# Patient Record
Sex: Female | Born: 1990 | Race: Black or African American | Hispanic: No | Marital: Single | Smoking: Never smoker
Health system: Southern US, Community
[De-identification: ages and names within clinical notes are randomized; demographics above are authoritative.]

## PROBLEM LIST (undated history)

## (undated) DIAGNOSIS — I10 Essential (primary) hypertension: Secondary | ICD-10-CM

---
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
## 2008-03-24 ENCOUNTER — Inpatient Hospital Stay (HOSPITAL_COMMUNITY): Admission: AD | Admit: 2008-03-24 | Discharge: 2008-03-24 | Payer: Self-pay | Admitting: Obstetrics & Gynecology

## 2010-05-29 LAB — URINALYSIS, ROUTINE W REFLEX MICROSCOPIC
Bilirubin Urine: NEGATIVE
Protein, ur: NEGATIVE mg/dL
Urobilinogen, UA: 0.2 mg/dL (ref 0.0–1.0)

## 2010-05-29 LAB — WET PREP, GENITAL
Trich, Wet Prep: NONE SEEN
Yeast Wet Prep HPF POC: NONE SEEN

## 2010-05-29 LAB — GC/CHLAMYDIA PROBE AMP, GENITAL: Chlamydia, DNA Probe: NEGATIVE

## 2020-09-14 ENCOUNTER — Encounter (HOSPITAL_BASED_OUTPATIENT_CLINIC_OR_DEPARTMENT_OTHER): Payer: Self-pay | Admitting: Emergency Medicine

## 2020-09-14 ENCOUNTER — Emergency Department (HOSPITAL_BASED_OUTPATIENT_CLINIC_OR_DEPARTMENT_OTHER)
Admission: EM | Admit: 2020-09-14 | Discharge: 2020-09-14 | Disposition: A | Discharge status: Home / Self Care | Payer: Medicaid - Out of State | Attending: Emergency Medicine | Admitting: Emergency Medicine

## 2020-09-14 ENCOUNTER — Other Ambulatory Visit: Payer: Self-pay

## 2020-09-14 ENCOUNTER — Emergency Department (HOSPITAL_BASED_OUTPATIENT_CLINIC_OR_DEPARTMENT_OTHER): Payer: Medicaid - Out of State

## 2020-09-14 DIAGNOSIS — I10 Essential (primary) hypertension: Secondary | ICD-10-CM | POA: Diagnosis not present

## 2020-09-14 DIAGNOSIS — D72829 Elevated white blood cell count, unspecified: Secondary | ICD-10-CM | POA: Insufficient documentation

## 2020-09-14 DIAGNOSIS — Z28310 Unvaccinated for covid-19: Secondary | ICD-10-CM | POA: Diagnosis not present

## 2020-09-14 DIAGNOSIS — R1013 Epigastric pain: Secondary | ICD-10-CM | POA: Insufficient documentation

## 2020-09-14 DIAGNOSIS — R112 Nausea with vomiting, unspecified: Secondary | ICD-10-CM | POA: Diagnosis not present

## 2020-09-14 HISTORY — DX: Essential (primary) hypertension: I10

## 2020-09-14 LAB — COMPREHENSIVE METABOLIC PANEL
ALT: 12 U/L (ref 0–44)
AST: 18 U/L (ref 15–41)
Albumin: 4.3 g/dL (ref 3.5–5.0)
Alkaline Phosphatase: 71 U/L (ref 38–126)
Anion gap: 15 (ref 5–15)
BUN: 6 mg/dL (ref 6–20)
CO2: 23 mmol/L (ref 22–32)
Calcium: 9.7 mg/dL (ref 8.9–10.3)
Chloride: 99 mmol/L (ref 98–111)
Creatinine, Ser: 0.61 mg/dL (ref 0.44–1.00)
GFR, Estimated: >60 mL/min (ref >60)
Glucose, Bld: 125 mg/dL — ABNORMAL HIGH (ref 70–99)
Potassium: 2.9 mmol/L — ABNORMAL LOW (ref 3.5–5.1)
Sodium: 137 mmol/L (ref 135–145)
Total Bilirubin: 1.1 mg/dL (ref 0.3–1.2)
Total Protein: 7.6 g/dL (ref 6.5–8.1)

## 2020-09-14 LAB — CBC
HCT: 43.3 % (ref 36.0–46.0)
Hemoglobin: 15.5 g/dL — ABNORMAL HIGH (ref 12.0–15.0)
MCH: 29.6 pg (ref 26.0–34.0)
MCHC: 35.8 g/dL (ref 30.0–36.0)
MCV: 82.8 fL (ref 80.0–100.0)
Platelets: 349 10*3/uL (ref 150–400)
RBC: 5.23 MIL/uL — ABNORMAL HIGH (ref 3.87–5.11)
RDW: 12.7 % (ref 11.5–15.5)
WBC: 14.9 10*3/uL — ABNORMAL HIGH (ref 4.0–10.5)
nRBC: 0 % (ref 0.0–0.2)

## 2020-09-14 LAB — LIPASE, BLOOD: Lipase: 25 U/L (ref 11–51)

## 2020-09-14 LAB — URINALYSIS, ROUTINE W REFLEX MICROSCOPIC
Bilirubin Urine: NEGATIVE
Glucose, UA: NEGATIVE mg/dL
Ketones, ur: 40 mg/dL — AB
Nitrite: NEGATIVE
Protein, ur: >300 mg/dL — AB
RBC / HPF: >50 RBC/hpf — ABNORMAL HIGH (ref 0–5)
Specific Gravity, Urine: 1.034 — ABNORMAL HIGH (ref 1.005–1.030)
pH: 8 (ref 5.0–8.0)

## 2020-09-14 LAB — PREGNANCY, URINE: Preg Test, Ur: NEGATIVE

## 2020-09-14 MED ORDER — CEPHALEXIN 500 MG PO CAPS: 500.0000 mg | ORAL_CAPSULE | Freq: Four times a day (QID) | ORAL | 0 refills | Status: DC

## 2020-09-14 MED ORDER — SODIUM CHLORIDE 0.9 % IV BOLUS
1000.0000 mL | Freq: Once | INTRAVENOUS | Status: AC
  Administered 2020-09-14: 1000 mL via INTRAVENOUS

## 2020-09-14 MED ORDER — LIDOCAINE VISCOUS HCL 2 % MT SOLN
15.0000 mL | Freq: Once | OROMUCOSAL | Status: AC
  Administered 2020-09-14: 15 mL via ORAL
  Filled 2020-09-14: qty 15

## 2020-09-14 MED ORDER — MORPHINE SULFATE (PF) 2 MG/ML IV SOLN
2.0000 mg | Freq: Once | INTRAVENOUS | Status: AC
  Administered 2020-09-14: 2 mg via INTRAVENOUS
  Filled 2020-09-14: qty 1

## 2020-09-14 MED ORDER — ONDANSETRON HCL 4 MG/2ML IJ SOLN
4.0000 mg | Freq: Once | INTRAMUSCULAR | Status: AC
Start: 2020-09-14 — End: 2020-09-14
  Administered 2020-09-14: 4 mg via INTRAVENOUS
  Filled 2020-09-14: qty 2

## 2020-09-14 MED ORDER — SODIUM CHLORIDE 0.9 % IV SOLN
1.0000 g | Freq: Once | INTRAVENOUS | Status: AC
  Administered 2020-09-14: 1 g via INTRAVENOUS
  Filled 2020-09-14: qty 10

## 2020-09-14 MED ORDER — SODIUM CHLORIDE 0.9 % IV SOLN
25.0000 mg | Freq: Once | INTRAVENOUS | Status: AC
  Administered 2020-09-14: 25 mg via INTRAVENOUS
  Filled 2020-09-14: qty 1

## 2020-09-14 MED ORDER — PROMETHAZINE HCL 25 MG PO TABS: 25.0000 mg | ORAL_TABLET | Freq: Four times a day (QID) | ORAL | 0 refills | Status: AC | PRN

## 2020-09-14 MED ORDER — PROMETHAZINE HCL 25 MG/ML IJ SOLN
INTRAMUSCULAR | Status: AC
  Filled 2020-09-14: qty 1

## 2020-09-14 MED ORDER — ONDANSETRON 8 MG PO TBDP: 8.0000 mg | ORAL_TABLET | Freq: Three times a day (TID) | ORAL | 0 refills | Status: AC | PRN

## 2020-09-14 MED ORDER — IOHEXOL 300 MG/ML  SOLN
80.0000 mL | Freq: Once | INTRAMUSCULAR | Status: AC | PRN
  Administered 2020-09-14: 80 mL via INTRAVENOUS

## 2020-09-14 MED ORDER — ALUM & MAG HYDROXIDE-SIMETH 200-200-20 MG/5ML PO SUSP
30.0000 mL | Freq: Once | ORAL | Status: AC
  Administered 2020-09-14: 30 mL via ORAL
  Filled 2020-09-14: qty 30

## 2020-09-14 NOTE — ED Triage Notes (Signed)
Pt arrives to ED with c/o of epigastric abdominal pain that started last night after drinking tequila. The pain is constant and described as throbbing. Pt reports multiple episodes of emesis. Pt denies SOB, CP, and diarrhea.

## 2020-09-14 NOTE — ED Provider Notes (Signed)
[facility] EMERGENCY DEPT Provider Note   CSN: [id number] Arrival date & time: 09/14/20  1454     History Chief Complaint  Patient presents with   Abdominal Pain    Lena Dewing is a 30 y.o. female.  HPI 30 year old female presents today complaining of epigastric pain, nausea, vomiting.  She states this began last night about midnight after consuming a large amount of tequila.  She states this is happened to her before after alcohol intake.  She has had ongoing nausea and vomiting vomiting up to 20 times.  She states it began as bilious but she has seen some blood in it.  She had a similar episode after alcohol intake in the past, but is not a regular drinker.  She denies headache, head injury, chest pain, dyspnea, abnormal bowel movements or diarrhea.  She has not noted any blood in her stool.  She has not had COVID has not received COVID-vaccine.  She reports normal menstrual cycles with her last period 2 weeks ago and a normal period and normal time.     Past Medical History:  Diagnosis Date   Hypertension     There are no problems to display for this patient.   History reviewed. No pertinent surgical history.   OB History   No obstetric history on file.     History reviewed. No pertinent family history.  Social History   Tobacco Use   Smoking status: Never   Smokeless tobacco: Never  Vaping Use   Vaping Use: Every day    Home Medications Prior to Admission medications   Not on File    Allergies    Patient has no known allergies.  Review of Systems   Review of Systems  All other systems reviewed and are negative.  Physical Exam Updated Vital Signs BP (!) 162/102 (BP Location: Right Arm)   Pulse 61   Temp 98.5 F (36.9 C) (Oral)   Resp (!) 26   Ht 1.676 m (5\' 6" )   Wt 90.7 kg   LMP 09/07/2020 (Approximate)   SpO2 100%   BMI 32.28 kg/m   Physical Exam Vitals and nursing note reviewed.  Constitutional:      General: She is in  acute distress.     Appearance: She is well-developed and normal weight. She is not ill-appearing.  HENT:     Head: Normocephalic.     Mouth/Throat:     Mouth: Mucous membranes are moist.  Cardiovascular:     Rate and Rhythm: Normal rate.  Pulmonary:     Effort: Pulmonary effort is normal.     Breath sounds: Normal breath sounds.  Abdominal:     General: Abdomen is flat. Bowel sounds are normal.     Palpations: Abdomen is soft.     Tenderness: There is abdominal tenderness in the epigastric area.  Skin:    General: Skin is warm and dry.     Capillary Refill: Capillary refill takes less than 2 seconds.  Neurological:     General: No focal deficit present.     Mental Status: She is alert.  Psychiatric:        Mood and Affect: Mood normal.    ED Results / Procedures / Treatments   Labs (all labs ordered are listed, but only abnormal results are displayed) Labs Reviewed  CBC - Abnormal; Notable for the following components:      Result Value   RBC 5.23 (*)    Hemoglobin 15.5 (*)  All other components within normal limits  LIPASE, BLOOD  COMPREHENSIVE METABOLIC PANEL  URINALYSIS, ROUTINE W REFLEX MICROSCOPIC  PREGNANCY, URINE    EKG None  Radiology No results found.  Procedures Procedures   Medications Ordered in ED Medications  sodium chloride 0.9 % bolus 1,000 mL (has no administration in time range)  ondansetron (ZOFRAN) injection 4 mg (has no administration in time range)  morphine 2 MG/ML injection 2 mg (has no administration in time range)    ED Course  I have reviewed the triage vital signs and the nursing notes.  Pertinent labs & imaging results that were available during my care of the patient were reviewed by me and considered in my medical decision making (see chart for details).  Clinical Course as of 09/14/20 1829  Thu Sep 14, 2020  1826 Patient feels improved and has tolerated a small amount of water.  No vomiting since last assessment. No  UTI sxs and not currently menstruating.  Urine with some ketones, rbc, epithelial cells-  [DR]    Clinical Course User Index [DR] Margarita Grizzle, MD   MDM Rules/Calculators/A&P                           30 year old female with epigastric pain after alcohol intake, nausea, and vomiting.  Labs obtained did not show any evidence of acute abnormality with the exception of some mild leukocytosis and urinalysis that shows ketonuria, glucose, proteinuria, with greater than 50 red blood cells, 11-20 squamous epithelial cells, and 21-50 white blood cells.  She is not having symptoms consistent with urinary tract infection such as frequency or pain with urination.  However, given urinalysis I am treating here with a gram of Rocephin. Final Clinical Impression(s) / ED Diagnoses Final diagnoses:  None    Rx / DC Orders ED Discharge Orders     None        Margarita Grizzle, MD 09/15/20 1504

## 2020-09-14 NOTE — Discharge Instructions (Addendum)
Avoid alcohol, nonsteroidal anti-inflammatory medications.  Take nausea medicines as needed and drink only clear liquids for the next 24 hours.  Gently advance your diet with soft foods that are on spicy and low-fat.  Take antibiotics for urinary tract infection. Follow-up with your primary care doctor in the next week. Return to the emergency department if you are worse anytime.

## 2020-09-15 ENCOUNTER — Emergency Department (HOSPITAL_BASED_OUTPATIENT_CLINIC_OR_DEPARTMENT_OTHER)
Admission: EM | Admit: 2020-09-15 | Discharge: 2020-09-15 | Disposition: A | Discharge status: Home / Self Care | Payer: Medicaid - Out of State | Attending: Emergency Medicine | Admitting: Emergency Medicine

## 2020-09-15 ENCOUNTER — Encounter (HOSPITAL_BASED_OUTPATIENT_CLINIC_OR_DEPARTMENT_OTHER): Payer: Self-pay | Admitting: Emergency Medicine

## 2020-09-15 ENCOUNTER — Emergency Department (HOSPITAL_BASED_OUTPATIENT_CLINIC_OR_DEPARTMENT_OTHER): Payer: Medicaid - Out of State

## 2020-09-15 DIAGNOSIS — K920 Hematemesis: Secondary | ICD-10-CM | POA: Diagnosis not present

## 2020-09-15 DIAGNOSIS — I10 Essential (primary) hypertension: Secondary | ICD-10-CM | POA: Insufficient documentation

## 2020-09-15 DIAGNOSIS — R079 Chest pain, unspecified: Secondary | ICD-10-CM | POA: Diagnosis not present

## 2020-09-15 DIAGNOSIS — R1013 Epigastric pain: Secondary | ICD-10-CM | POA: Diagnosis present

## 2020-09-15 DIAGNOSIS — K2920 Alcoholic gastritis without bleeding: Secondary | ICD-10-CM | POA: Insufficient documentation

## 2020-09-15 LAB — URINALYSIS, ROUTINE W REFLEX MICROSCOPIC
Bilirubin Urine: NEGATIVE
Glucose, UA: NEGATIVE mg/dL
Ketones, ur: 15 mg/dL — AB
Leukocytes,Ua: NEGATIVE
Nitrite: NEGATIVE
Protein, ur: 30 mg/dL — AB
Specific Gravity, Urine: 1.009 (ref 1.005–1.030)
pH: 7 (ref 5.0–8.0)

## 2020-09-15 LAB — COMPREHENSIVE METABOLIC PANEL
ALT: 11 U/L (ref 0–44)
AST: 17 U/L (ref 15–41)
Albumin: 4.5 g/dL (ref 3.5–5.0)
Alkaline Phosphatase: 70 U/L (ref 38–126)
Anion gap: 14 (ref 5–15)
BUN: <5 mg/dL — ABNORMAL LOW (ref 6–20)
CO2: 22 mmol/L (ref 22–32)
Calcium: 9.8 mg/dL (ref 8.9–10.3)
Chloride: 97 mmol/L — ABNORMAL LOW (ref 98–111)
Creatinine, Ser: 0.65 mg/dL (ref 0.44–1.00)
GFR, Estimated: >60 mL/min (ref >60)
Glucose, Bld: 121 mg/dL — ABNORMAL HIGH (ref 70–99)
Potassium: 2.8 mmol/L — ABNORMAL LOW (ref 3.5–5.1)
Sodium: 133 mmol/L — ABNORMAL LOW (ref 135–145)
Total Bilirubin: 1.2 mg/dL (ref 0.3–1.2)
Total Protein: 7.9 g/dL (ref 6.5–8.1)

## 2020-09-15 LAB — CBC
HCT: 44.5 % (ref 36.0–46.0)
Hemoglobin: 16.1 g/dL — ABNORMAL HIGH (ref 12.0–15.0)
MCH: 29.9 pg (ref 26.0–34.0)
MCHC: 36.2 g/dL — ABNORMAL HIGH (ref 30.0–36.0)
MCV: 82.7 fL (ref 80.0–100.0)
Platelets: 350 10*3/uL (ref 150–400)
RBC: 5.38 MIL/uL — ABNORMAL HIGH (ref 3.87–5.11)
RDW: 12.5 % (ref 11.5–15.5)
WBC: 15.9 10*3/uL — ABNORMAL HIGH (ref 4.0–10.5)
nRBC: 0 % (ref 0.0–0.2)

## 2020-09-15 LAB — TROPONIN I (HIGH SENSITIVITY): Troponin I (High Sensitivity): 5 ng/L (ref <18)

## 2020-09-15 LAB — PREGNANCY, URINE: Preg Test, Ur: NEGATIVE

## 2020-09-15 LAB — LIPASE, BLOOD: Lipase: 46 U/L (ref 11–51)

## 2020-09-15 MED ORDER — HYDROCODONE-ACETAMINOPHEN 5-325 MG PO TABS: 1.0000 | ORAL_TABLET | Freq: Four times a day (QID) | ORAL | 0 refills | Status: DC | PRN

## 2020-09-15 MED ORDER — METOCLOPRAMIDE HCL 5 MG/ML IJ SOLN
10.0000 mg | Freq: Once | INTRAMUSCULAR | Status: AC
  Administered 2020-09-15: 10 mg via INTRAVENOUS
  Filled 2020-09-15: qty 2

## 2020-09-15 MED ORDER — PANTOPRAZOLE SODIUM 40 MG IV SOLR
40.0000 mg | Freq: Once | INTRAVENOUS | Status: AC
  Administered 2020-09-15: 40 mg via INTRAVENOUS
  Filled 2020-09-15: qty 40

## 2020-09-15 MED ORDER — HYDROMORPHONE HCL 1 MG/ML IJ SOLN
1.0000 mg | Freq: Once | INTRAMUSCULAR | Status: AC
Start: 2020-09-15 — End: 2020-09-15
  Administered 2020-09-15: 1 mg via INTRAVENOUS
  Filled 2020-09-15: qty 1

## 2020-09-15 MED ORDER — LIDOCAINE VISCOUS HCL 2 % MT SOLN
15.0000 mL | Freq: Once | OROMUCOSAL | Status: AC
  Administered 2020-09-15: 15 mL via ORAL
  Filled 2020-09-15: qty 15

## 2020-09-15 MED ORDER — LACTATED RINGERS IV BOLUS
1000.0000 mL | Freq: Once | INTRAVENOUS | Status: AC
  Administered 2020-09-15: 1000 mL via INTRAVENOUS

## 2020-09-15 MED ORDER — ALUM & MAG HYDROXIDE-SIMETH 200-200-20 MG/5ML PO SUSP
30.0000 mL | Freq: Once | ORAL | Status: AC
  Administered 2020-09-15: 30 mL via ORAL
  Filled 2020-09-15: qty 30

## 2020-09-15 MED ORDER — OMEPRAZOLE 20 MG PO CPDR: 20.0000 mg | DELAYED_RELEASE_CAPSULE | Freq: Every day | ORAL | 0 refills | Status: AC

## 2020-09-15 MED ORDER — POTASSIUM CHLORIDE 10 MEQ/100ML IV SOLN
10.0000 meq | INTRAVENOUS | Status: AC
  Administered 2020-09-15 (×2): 10 meq via INTRAVENOUS
  Filled 2020-09-15 (×2): qty 100

## 2020-09-15 MED ORDER — MORPHINE SULFATE (PF) 4 MG/ML IV SOLN
4.0000 mg | Freq: Once | INTRAVENOUS | Status: AC
Start: 2020-09-15 — End: 2020-09-15
  Administered 2020-09-15: 4 mg via INTRAVENOUS
  Filled 2020-09-15: qty 1

## 2020-09-15 NOTE — ED Notes (Signed)
Patient refused EKG, states "it's too painful.  Advised patient we needed EKG due to chest pain.  Patient states she will have it later.

## 2020-09-15 NOTE — ED Provider Notes (Signed)
[facility] EMERGENCY DEPT Provider Note   CSN: [id number] Arrival date & time: 09/15/20  1129     History Chief Complaint  Patient presents with   Abdominal Pain   Chest Pain    Emily Mullins is a 30 y.o. female.  Patient is a 30 year old female with a history of hypertension but no other significant medical problems who is returning today due to persistent epigastric discomfort, vomiting and now pain that radiates into her chest.  Patient was seen in the emergency room last night after drinking tequila and starting to have profuse vomiting and epigastric pain.  At that time she was found to be hypokalemic but had a negative CT and negative lipase.  Patient was given medications and she reported she was not back to baseline but better when she went home.  Since returning home around 1 AM she started vomiting and reports she has vomited numerous times.  The pain is getting worse and now radiating into her chest and is worse with every episode of emesis.  She has not had any shortness of breath but since vomiting here has noticed some blood-streaked emesis.  She has no lower abdominal pain or urinary complaints.  She was given Keflex for possible UTI.  She has not had fever and reports she does not usually drink alcohol and does not have known history of ulcer disease.  The history is provided by the patient and medical records.  Abdominal Pain Pain location:  Epigastric Pain quality: cramping, gnawing, sharp and shooting   Pain radiates to:  Chest Pain severity:  Severe Onset quality:  Gradual Duration:  36 hours Timing:  Constant Progression:  Worsening Chronicity:  New Context: alcohol use   Relieved by:  Nothing Worsened by:  Vomiting and eating Ineffective treatments: nausea meds. Associated symptoms: anorexia, chest pain, hematemesis, nausea and vomiting   Associated symptoms: no cough, no diarrhea, no dysuria and no shortness of breath   Chest Pain Associated  symptoms: abdominal pain, anorexia, nausea and vomiting   Associated symptoms: no cough and no shortness of breath       Past Medical History:  Diagnosis Date   Hypertension     There are no problems to display for this patient.   History reviewed. No pertinent surgical history.   OB History   No obstetric history on file.     History reviewed. No pertinent family history.  Social History   Tobacco Use   Smoking status: Never   Smokeless tobacco: Never  Vaping Use   Vaping Use: Every day    Home Medications Prior to Admission medications   Medication Sig Start Date End Date Taking? Authorizing Provider  cephALEXin (KEFLEX) 500 MG capsule Take 1 capsule (500 mg total) by mouth 4 (four) times daily. 09/14/20   Margarita Grizzle, MD  ondansetron (ZOFRAN ODT) 8 MG disintegrating tablet Take 1 tablet (8 mg total) by mouth every 8 (eight) hours as needed for nausea or vomiting. 09/14/20   Margarita Grizzle, MD  promethazine (PHENERGAN) 25 MG tablet Take 1 tablet (25 mg total) by mouth every 6 (six) hours as needed for nausea or vomiting. 09/14/20   Margarita Grizzle, MD    Allergies    Patient has no known allergies.  Review of Systems   Review of Systems  Respiratory:  Negative for cough and shortness of breath.   Cardiovascular:  Positive for chest pain.  Gastrointestinal:  Positive for abdominal pain, anorexia, hematemesis, nausea and vomiting. Negative for diarrhea.  Genitourinary:  Negative for dysuria.  All other systems reviewed and are negative.  Physical Exam Updated Vital Signs BP (!) 154/69   Pulse 81   Temp 98.7 F (37.1 C) (Oral)   Resp 18   LMP 09/07/2020 (Approximate)   SpO2 99%   Physical Exam Vitals and nursing note reviewed.  Constitutional:      General: She is not in acute distress.    Appearance: She is well-developed. She is ill-appearing.  HENT:     Head: Normocephalic and atraumatic.     Mouth/Throat:     Mouth: Mucous membranes are dry.  Eyes:      Pupils: Pupils are equal, round, and reactive to light.  Cardiovascular:     Rate and Rhythm: Normal rate and regular rhythm.     Heart sounds: Normal heart sounds. No murmur heard.   No friction rub.  Pulmonary:     Effort: Pulmonary effort is normal.     Breath sounds: Normal breath sounds. No wheezing or rales.  Chest:     Chest wall: Tenderness present.  Abdominal:     General: Bowel sounds are normal. There is no distension.     Palpations: Abdomen is soft.     Tenderness: There is abdominal tenderness in the epigastric area. There is no guarding or rebound.  Musculoskeletal:        General: No tenderness. Normal range of motion.     Cervical back: Normal range of motion and neck supple.     Right lower leg: No edema.     Left lower leg: No edema.     Comments: No edema  Skin:    General: Skin is warm and dry.     Findings: No rash.  Neurological:     Mental Status: She is alert and oriented to person, place, and time. Mental status is at baseline.     Cranial Nerves: No cranial nerve deficit.  Psychiatric:        Behavior: Behavior normal.    ED Results / Procedures / Treatments   Labs (all labs ordered are listed, but only abnormal results are displayed) Labs Reviewed  COMPREHENSIVE METABOLIC PANEL - Abnormal; Notable for the following components:      Result Value   Sodium 133 (*)    Potassium 2.8 (*)    Chloride 97 (*)    Glucose, Bld 121 (*)    BUN <5 (*)    All other components within normal limits  CBC - Abnormal; Notable for the following components:   WBC 15.9 (*)    RBC 5.38 (*)    Hemoglobin 16.1 (*)    MCHC 36.2 (*)    All other components within normal limits  LIPASE, BLOOD  URINALYSIS, ROUTINE W REFLEX MICROSCOPIC  PREGNANCY, URINE  TROPONIN I (HIGH SENSITIVITY)  TROPONIN I (HIGH SENSITIVITY)    EKG EKG Interpretation  Date/Time:  Friday September 15 2020 11:45:27 EDT Ventricular Rate:  57 PR Interval:  154 QRS Duration: 92 QT  Interval:  470 QTC Calculation: 457 R Axis:   68 Text Interpretation: Sinus bradycardia Nonspecific T wave abnormality No previous tracing Confirmed by Gwyneth Sprout ([id number]) on 09/15/2020 3:52:19 PM  Radiology CT ABDOMEN PELVIS W CONTRAST  Result Date: 09/14/2020 CLINICAL DATA:  Epigastric pain EXAM: CT ABDOMEN AND PELVIS WITH CONTRAST TECHNIQUE: Multidetector CT imaging of the abdomen and pelvis was performed using the standard protocol following bolus administration of intravenous contrast. CONTRAST:  74mL OMNIPAQUE IOHEXOL 300 MG/ML  SOLN COMPARISON:  None. FINDINGS: Lower chest: Lung bases are clear. No effusions. Heart is normal size. Hepatobiliary: No focal hepatic abnormality. Gallbladder unremarkable. Pancreas: No focal abnormality or ductal dilatation. Spleen: No focal abnormality.  Normal size. Adrenals/Urinary Tract: No adrenal abnormality. No focal renal abnormality. No stones or hydronephrosis. Urinary bladder is unremarkable. Stomach/Bowel: Normal appendix. Stomach, large and small bowel grossly unremarkable. Vascular/Lymphatic: No evidence of aneurysm or adenopathy. Reproductive: Uterus and adnexa unremarkable.  No mass. Other: No free fluid or free air. Musculoskeletal: No acute bony abnormality. IMPRESSION: No acute findings in the abdomen or pelvis. Electronically Signed   By: Charlett Nose M.D.   On: 09/14/2020 16:51    Procedures Procedures   Medications Ordered in ED Medications  pantoprazole (PROTONIX) injection 40 mg (has no administration in time range)  metoCLOPramide (REGLAN) injection 10 mg (has no administration in time range)  morphine 4 MG/ML injection 4 mg (has no administration in time range)  lactated ringers bolus 1,000 mL (has no administration in time range)  potassium chloride 10 mEq in 100 mL IVPB (has no administration in time range)    ED Course  I have reviewed the triage vital signs and the nursing notes.  Pertinent labs & imaging results that were  available during my care of the patient were reviewed by me and considered in my medical decision making (see chart for details).    MDM Rules/Calculators/A&P                           Patient returning today for persistent vomiting.  This is in the setting of drinking significant mount of tequila last night.  She was initially seen in the emergency room and given supportive care.  She was treated with Phenergan and Keflex to go home with due to concern for UTI.  Since being home patient has had recurrent vomiting.  She is still having significant epigastric pain now radiating into her chest.  At this time low suspicion for an acute cardiac event think this is still related to an alcoholic gastritis.  Lipase is within normal limits.  Patient is hypokalemic at 2.8 and has leukocytosis of 14,000 is hemoconcentrated with a hemoglobin of 16.  Troponin is within normal limits.  Patient sats are 99% on room air.  We will give pain control, fluids and Protonix.  Chest x-ray is pending.  Low suspicion for Boerhaave's tear or esophageal perforation.  9:54 PM After meds patient is feeling better.  She has been able to tolerate p.o.'s.  Suspect an alcoholic gastritis.  Chest x-ray within normal limits.  Patient discharged home with PPI and pain control.  She already has antiemetics.  MDM   Amount and/or Complexity of Data Reviewed Clinical lab tests: ordered and reviewed Tests in the radiology section of CPT: ordered and reviewed Tests in the medicine section of CPT: ordered and reviewed Independent visualization of images, tracings, or specimens: yes    Final Clinical Impression(s) / ED Diagnoses Final diagnoses:  Acute alcoholic gastritis without hemorrhage    Rx / DC Orders ED Discharge Orders          Ordered    HYDROcodone-acetaminophen (NORCO/VICODIN) 5-325 MG tablet  Every 6 hours PRN        09/15/20 2154    omeprazole (PRILOSEC) 20 MG capsule  Daily        09/15/20 2154  Gwyneth SproutPlunkett, Kosha Jaquith, MD 09/15/20 2155

## 2020-09-15 NOTE — Discharge Instructions (Signed)
I am concerned that you have significant irritation of your stomach from the alcohol yesterday and it is very inflamed which is what is causing the burning and the pain.  You can take the nausea medication but also I want you to take an antacid which in the next 3 to 4 days should hopefully stopped the acid production in your stomach.  You can use the pain medication as needed in the meantime.

## 2020-09-15 NOTE — ED Triage Notes (Signed)
Pt arrives to ED with c/o of epigastric pain that started again at 1am last night. Pt was seen here and treated for same yesterday. Pt w/ negative CT abdomen and received fluids, phenergan, and morphine and was dx w/o pain and N/V. Pt reports at 1am last night she started vomiting again and now the pain is a sharp and burning sensation in her mid-chest. Pt reports taking her first dose of Cephalexin last night.

## 2021-09-20 ENCOUNTER — Other Ambulatory Visit: Payer: Self-pay

## 2021-09-20 ENCOUNTER — Encounter (HOSPITAL_BASED_OUTPATIENT_CLINIC_OR_DEPARTMENT_OTHER): Payer: Self-pay | Admitting: Emergency Medicine

## 2021-09-20 ENCOUNTER — Emergency Department (HOSPITAL_BASED_OUTPATIENT_CLINIC_OR_DEPARTMENT_OTHER): Payer: Medicaid - Out of State

## 2021-09-20 ENCOUNTER — Emergency Department (HOSPITAL_BASED_OUTPATIENT_CLINIC_OR_DEPARTMENT_OTHER)
Admission: EM | Admit: 2021-09-20 | Discharge: 2021-09-20 | Disposition: A | Discharge status: Home / Self Care | Payer: Medicaid - Out of State | Attending: Emergency Medicine | Admitting: Emergency Medicine

## 2021-09-20 DIAGNOSIS — A64 Unspecified sexually transmitted disease: Secondary | ICD-10-CM | POA: Diagnosis not present

## 2021-09-20 DIAGNOSIS — Z3A01 Less than 8 weeks gestation of pregnancy: Secondary | ICD-10-CM | POA: Diagnosis not present

## 2021-09-20 DIAGNOSIS — R102 Pelvic and perineal pain: Secondary | ICD-10-CM | POA: Diagnosis not present

## 2021-09-20 DIAGNOSIS — O23591 Infection of other part of genital tract in pregnancy, first trimester: Secondary | ICD-10-CM | POA: Insufficient documentation

## 2021-09-20 DIAGNOSIS — O209 Hemorrhage in early pregnancy, unspecified: Secondary | ICD-10-CM | POA: Diagnosis present

## 2021-09-20 DIAGNOSIS — A5901 Trichomonal vulvovaginitis: Secondary | ICD-10-CM

## 2021-09-20 LAB — CBC WITH DIFFERENTIAL/PLATELET
Abs Immature Granulocytes: 0.02 10*3/uL (ref 0.00–0.07)
Basophils Absolute: 0.1 10*3/uL (ref 0.0–0.1)
Basophils Relative: 1 %
Eosinophils Absolute: 0.1 10*3/uL (ref 0.0–0.5)
Eosinophils Relative: 1 %
HCT: 38.3 % (ref 36.0–46.0)
Hemoglobin: 13.6 g/dL (ref 12.0–15.0)
Immature Granulocytes: 0 %
Lymphocytes Relative: 19 %
Lymphs Abs: 1.8 10*3/uL (ref 0.7–4.0)
MCH: 28.9 pg (ref 26.0–34.0)
MCHC: 35.5 g/dL (ref 30.0–36.0)
MCV: 81.5 fL (ref 80.0–100.0)
Monocytes Absolute: 0.4 10*3/uL (ref 0.1–1.0)
Monocytes Relative: 5 %
Neutro Abs: 7 10*3/uL (ref 1.7–7.7)
Neutrophils Relative %: 74 %
Platelets: 262 10*3/uL (ref 150–400)
RBC: 4.7 MIL/uL (ref 3.87–5.11)
RDW: 12.3 % (ref 11.5–15.5)
WBC: 9.4 10*3/uL (ref 4.0–10.5)
nRBC: 0 % (ref 0.0–0.2)

## 2021-09-20 LAB — URINALYSIS, ROUTINE W REFLEX MICROSCOPIC
Bilirubin Urine: NEGATIVE
Glucose, UA: NEGATIVE mg/dL
Ketones, ur: NEGATIVE mg/dL
Nitrite: NEGATIVE
Specific Gravity, Urine: 1.022 (ref 1.005–1.030)
pH: 7 (ref 5.0–8.0)

## 2021-09-20 LAB — COMPREHENSIVE METABOLIC PANEL
ALT: 9 U/L (ref 0–44)
AST: 13 U/L — ABNORMAL LOW (ref 15–41)
Albumin: 4.3 g/dL (ref 3.5–5.0)
Alkaline Phosphatase: 49 U/L (ref 38–126)
Anion gap: 10 (ref 5–15)
BUN: 5 mg/dL — ABNORMAL LOW (ref 6–20)
CO2: 22 mmol/L (ref 22–32)
Calcium: 9.3 mg/dL (ref 8.9–10.3)
Chloride: 104 mmol/L (ref 98–111)
Creatinine, Ser: 0.52 mg/dL (ref 0.44–1.00)
GFR, Estimated: >60 mL/min (ref >60)
Glucose, Bld: 92 mg/dL (ref 70–99)
Potassium: 3.3 mmol/L — ABNORMAL LOW (ref 3.5–5.1)
Sodium: 136 mmol/L (ref 135–145)
Total Bilirubin: 0.6 mg/dL (ref 0.3–1.2)
Total Protein: 7.5 g/dL (ref 6.5–8.1)

## 2021-09-20 LAB — HCG, QUANTITATIVE, PREGNANCY: hCG, Beta Chain, Quant, S: 112258 m[IU]/mL — ABNORMAL HIGH (ref <5)

## 2021-09-20 LAB — PREGNANCY, URINE: Preg Test, Ur: POSITIVE — AB

## 2021-09-20 MED ORDER — AZITHROMYCIN 250 MG PO TABS
1000.0000 mg | ORAL_TABLET | Freq: Once | ORAL | Status: AC
Start: 2021-09-20 — End: 2021-09-20
  Administered 2021-09-20: 1000 mg via ORAL
  Filled 2021-09-20: qty 4

## 2021-09-20 MED ORDER — LIDOCAINE HCL (PF) 1 % IJ SOLN
1.0000 mL | Freq: Once | INTRAMUSCULAR | Status: AC
  Administered 2021-09-20: 1 mL
  Filled 2021-09-20: qty 5

## 2021-09-20 MED ORDER — METRONIDAZOLE 500 MG PO TABS: 500.0000 mg | ORAL_TABLET | Freq: Two times a day (BID) | ORAL | 0 refills | Status: AC

## 2021-09-20 MED ORDER — CEFTRIAXONE SODIUM 500 MG IJ SOLR
500.0000 mg | Freq: Once | INTRAMUSCULAR | Status: AC
  Administered 2021-09-20: 500 mg via INTRAMUSCULAR
  Filled 2021-09-20: qty 500

## 2021-09-20 NOTE — ED Triage Notes (Signed)
Last menstrual June 18 Last week had a +pregnancy test. Has been having a lot of cramping lower abdominal pain and lower back pains. Bleeding Tuesday "looked like a normal period"  No prenatal visits yet  "Just wants to get checked out"

## 2021-09-20 NOTE — Discharge Instructions (Addendum)
You were treated today for gonorrhea and chlamydia.  Will also be treated for trichomonas which was found in your urine, I have prescribed an antibiotic to help treatment of this.  Please take 1 tablet twice a day for the next 7 days.

## 2021-09-20 NOTE — ED Provider Notes (Signed)
[facility] EMERGENCY DEPT Provider Note   CSN: [id number] Arrival date & time: 09/20/21  1531     History Chief Complaint  Patient presents with   Possible Pregnancy    With abdominal pain    Emily Mullins is a 31 y.o. female.  31 y.o female G2P1 currently < [redacted] weeks pregnant presents to the ED with a chief complaint of lower abdominal cramping.  Patient reports she took a pregnancy test on June 18, this was positive.  Her last menstrual cycle was around that time.  She reports some lower abdominal cramping, exacerbated with movement. Has taken tylenol without any improvement in symptoms.  Also reports some vaginal bleeding such as spotting, "like a normal period ".  She does report feeling somewhat dizziness and weak when she was at her daughter's birthday recently.  He has not had any prenatal care, she reports "I am waiting on my Medicaid ".  She is also endorsing a headache, does have a longstanding history of headaches however she reports they have gotten worse since her pregnancy. No prior prenatal care.   The history is provided by the patient and medical records.  Possible Pregnancy This is a new problem. Associated symptoms include abdominal pain. Pertinent negatives include no chest pain and no shortness of breath.       Home Medications Prior to Admission medications   Medication Sig Start Date End Date Taking? Authorizing Provider  metroNIDAZOLE (FLAGYL) 500 MG tablet Take 1 tablet (500 mg total) by mouth 2 (two) times daily for 7 days. 09/20/21 09/27/21 Yes Catia Todorov, PA-C  cephALEXin (KEFLEX) 500 MG capsule Take 1 capsule (500 mg total) by mouth 4 (four) times daily. 09/14/20   Margarita Grizzle, MD  HYDROcodone-acetaminophen (NORCO/VICODIN) 5-325 MG tablet Take 1 tablet by mouth every 6 (six) hours as needed. 09/15/20   Gwyneth Sprout, MD  omeprazole (PRILOSEC) 20 MG capsule Take 1 capsule (20 mg total) by mouth daily. 09/15/20   Gwyneth Sprout, MD   ondansetron (ZOFRAN ODT) 8 MG disintegrating tablet Take 1 tablet (8 mg total) by mouth every 8 (eight) hours as needed for nausea or vomiting. 09/14/20   Margarita Grizzle, MD  promethazine (PHENERGAN) 25 MG tablet Take 1 tablet (25 mg total) by mouth every 6 (six) hours as needed for nausea or vomiting. 09/14/20   Margarita Grizzle, MD      Allergies    Patient has no known allergies.    Review of Systems   Review of Systems  Constitutional:  Negative for fever.  HENT:  Negative for sore throat.   Respiratory:  Negative for shortness of breath.   Cardiovascular:  Negative for chest pain.  Gastrointestinal:  Positive for abdominal pain. Negative for diarrhea, nausea and vomiting.  Genitourinary:  Positive for pelvic pain.  Musculoskeletal:  Negative for back pain.  Skin:  Negative for pallor and wound.  Neurological:  Negative for light-headedness and numbness.  All other systems reviewed and are negative.   Physical Exam Updated Vital Signs BP (!) 141/93   Pulse 64   Temp (!) 96.9 F (36.1 C) (Axillary)   Resp 16   LMP 08/28/2021   SpO2 100%  Physical Exam Vitals and nursing note reviewed.  Constitutional:      Appearance: Normal appearance.  HENT:     Head: Normocephalic and atraumatic.  Eyes:     Pupils: Pupils are equal, round, and reactive to light.  Cardiovascular:     Rate and Rhythm: Normal rate.  Pulmonary:     Effort: Pulmonary effort is normal.     Breath sounds: No wheezing or rales.  Abdominal:     General: Abdomen is flat.     Tenderness: There is abdominal tenderness.  Musculoskeletal:     Cervical back: Normal range of motion and neck supple.  Skin:    General: Skin is warm and dry.  Neurological:     Mental Status: She is alert and oriented to person, place, and time.     ED Results / Procedures / Treatments   Labs (all labs ordered are listed, but only abnormal results are displayed) Labs Reviewed  COMPREHENSIVE METABOLIC PANEL - Abnormal; Notable  for the following components:      Result Value   Potassium 3.3 (*)    BUN 5 (*)    AST 13 (*)    All other components within normal limits  PREGNANCY, URINE - Abnormal; Notable for the following components:   Preg Test, Ur POSITIVE (*)    All other components within normal limits  URINALYSIS, ROUTINE W REFLEX MICROSCOPIC - Abnormal; Notable for the following components:   APPearance HAZY (*)    Hgb urine dipstick MODERATE (*)    Protein, ur TRACE (*)    Leukocytes,Ua LARGE (*)    Bacteria, UA RARE (*)    Trichomonas, UA PRESENT (*)    All other components within normal limits  HCG, QUANTITATIVE, PREGNANCY - Abnormal; Notable for the following components:   hCG, Beta Chain, Quant, S 112,258 (*)    All other components within normal limits  CBC WITH DIFFERENTIAL/PLATELET    EKG None  Radiology US OB Comp < 14 Wks  Result Date: 09/20/2021 CLINICAL DATA:  Pelvic pain, back pain, vaginal bleeding EXAM: OBSTETRIC <14 WK ULTRASOUND TECHNIQUE: Transabdominal ultrasound was performed for evaluation of the gestation as well as the maternal uterus and adnexal regions. COMPARISON:  None Available. FINDINGS: Intrauterine gestational sac: Single Yolk sac:  Seen Embryo:  Seen Cardiac Activity: Seen Heart Rate: 169 bpm CRL:   17.6 mm   8 w 2 d                  [place] EDC: 04/30/2022 Subchorionic hemorrhage:  None visualized. Maternal uterus/adnexae: Unremarkable. IMPRESSION: Single live intrauterine pregnancy seen. Sonographically estimated gestational age is 8 weeks 2 days. Electronically Signed   By: Ernie Avena M.D.   On: 09/20/2021 18:13    Procedures Procedures    Medications Ordered in ED Medications  cefTRIAXone (ROCEPHIN) injection 500 mg (500 mg Intramuscular Given 09/20/21 1850)  lidocaine (PF) (XYLOCAINE) 1 % injection 1-2.1 mL (1 mL Other Given 09/20/21 1851)  azithromycin (ZITHROMAX) tablet 1,000 mg (1,000 mg Oral Given 09/20/21 1849)    ED Course/ Medical Decision Making/  A&P Clinical Course as of 09/20/21 1905  Thu Sep 20, 2021  1551 hCG, quantitative, pregnancy [JS]  1820 HCG, Beta Chain, Quant, S(!): 638,756 [JS]    Clinical Course User Index [JS] Claude Manges, PA-C                           Medical Decision Making Amount and/or Complexity of Data Reviewed Labs: ordered. Decision-making details documented in ED Course. Radiology: ordered.  Risk Prescription drug management.   This patient presents to the ED for concern of lower abdominal pain and pregnancy, this involves a number of treatment options, and is a complaint that carries with it a high risk of  complications and morbidity.  The differential diagnosis includes ectopic versus IUP.    Co morbidities: Discussed in HPI   Brief History:  Patient here complaining of lower abdominal cramping after a positive pregnancy test in the month of June, no prenatal care.  Here with daily headaches no relief with Tylenol.  EMR reviewed including pt PMHx, past surgical history and past visits to ER.   See HPI for more details   Lab Tests:  I ordered and independently interpreted labs.  The pertinent results include:    I personally reviewed all laboratory work and imaging. Metabolic panel without any acute abnormality specifically kidney function within normal limits and no significant electrolyte abnormalities. CBC without leukocytosis or significant anemia. UA with moderate hemoglobin, Large leukocytes, rare bacteria and trichomonas present.   Imaging Studies:  US OB: Single live intrauterine pregnancy seen. Sonographically estimated  gestational age is 8 weeks 2 days.   Medicines ordered:  I ordered medication including rocephin, azithromycin  for prophylactic treatment of gonorrhea and chlamydia after positive trichomonas in her urine. Reevaluation of the patient after these medicines showed that the patient stayed the same I have reviewed the patients home medicines and have made  adjustments as needed   Reevaluation:  After the interventions noted above I re-evaluated patient and found that they have :stayed the same   Social Determinants of Health:  The patient's social determinants of health were a factor in the care of this patient    Problem List / ED Course:  Patient to the ED with a chief complaint of lower abdominal cramping, had a positive pregnancy test in the month of June.  No prenatal care.  Labs were overall benign.  UA with trichomonas present and large amount of bacteria.  Ultrasound showing IUP, the results were given to patient for confirmation.  She was treated for gonorrhea and chlamydia with Rocephin along with azithromycin.  She will also go home on a short course of Flagyl for treatment of trichomonas.  She is agreeable to schedule appointment with OB/GYN.  Patient is overall in stable condition.   Dispostion:  After consideration of the diagnostic results and the patients response to treatment, I feel that the patent would benefit from treatment of trichomonas with Flagyl.  Will need to establish OB/GYN for her new pregnancy.    Portions of this note were generated with Scientist, clinical (histocompatibility and immunogenetics). Dictation errors may occur despite best attempts at proofreading.   Final Clinical Impression(s) / ED Diagnoses Final diagnoses:  Less than [redacted] weeks gestation of pregnancy  Trichomonas vaginitis  Sexually transmitted infection    Rx / DC Orders ED Discharge Orders          Ordered    metroNIDAZOLE (FLAGYL) 500 MG tablet  2 times daily        09/20/21 1903              Claude Manges, PA-C 09/20/21 1905    Virgina Norfolk, DO 09/24/21 [phone]

## 2021-09-20 NOTE — ED Notes (Signed)
Pt just drank cold water. Pt states that she felt fine

## 2021-09-20 NOTE — ED Notes (Signed)
Patient verbalizes understanding of discharge instructions. Opportunity for questioning and answers were provided. Armband removed by staff, pt discharged from ED. Ambulated out to lobby  

## 2022-04-08 ENCOUNTER — Other Ambulatory Visit: Payer: Self-pay

## 2022-04-08 ENCOUNTER — Encounter (HOSPITAL_BASED_OUTPATIENT_CLINIC_OR_DEPARTMENT_OTHER): Payer: Self-pay

## 2022-04-08 ENCOUNTER — Emergency Department (HOSPITAL_BASED_OUTPATIENT_CLINIC_OR_DEPARTMENT_OTHER)
Admission: EM | Admit: 2022-04-08 | Discharge: 2022-04-08 | Disposition: A | Discharge status: Home / Self Care | Payer: Medicaid - Out of State | Attending: Emergency Medicine | Admitting: Emergency Medicine

## 2022-04-08 ENCOUNTER — Other Ambulatory Visit (HOSPITAL_BASED_OUTPATIENT_CLINIC_OR_DEPARTMENT_OTHER): Payer: Self-pay

## 2022-04-08 DIAGNOSIS — N3000 Acute cystitis without hematuria: Secondary | ICD-10-CM | POA: Insufficient documentation

## 2022-04-08 DIAGNOSIS — R300 Dysuria: Secondary | ICD-10-CM | POA: Diagnosis present

## 2022-04-08 LAB — URINALYSIS, ROUTINE W REFLEX MICROSCOPIC
Bilirubin Urine: NEGATIVE
Glucose, UA: NEGATIVE mg/dL
Ketones, ur: NEGATIVE mg/dL
Nitrite: NEGATIVE
Protein, ur: NEGATIVE mg/dL
Specific Gravity, Urine: 1.013 (ref 1.005–1.030)
WBC, UA: >50 WBC/hpf (ref 0–5)
pH: 6 (ref 5.0–8.0)

## 2022-04-08 LAB — PREGNANCY, URINE: Preg Test, Ur: NEGATIVE

## 2022-04-08 MED ORDER — DICYCLOMINE HCL 20 MG PO TABS
20.0000 mg | ORAL_TABLET | Freq: Two times a day (BID) | ORAL | 0 refills | Status: AC
  Filled 2022-04-08: qty 20, 10d supply, fill #0

## 2022-04-08 MED ORDER — CEPHALEXIN 500 MG PO CAPS
500.0000 mg | ORAL_CAPSULE | Freq: Three times a day (TID) | ORAL | 0 refills | Status: AC
  Filled 2022-04-08: qty 15, 5d supply, fill #0

## 2022-04-08 NOTE — ED Triage Notes (Signed)
Patient here POV from Home.  Endorses Lower Pelvic Pain with Dysuria over 2-3 Days.  No fevers. No N/V/D. No Hematuria.   NAD Noted during Triage. A&Ox4. GCS 15. Ambulatory.

## 2022-04-08 NOTE — ED Provider Notes (Signed)
[place] Provider Note   CSN: [id number] Arrival date & time: 04/08/22  1415     History  Chief Complaint  Patient presents with   Dysuria    Emily Mullins is a 32 y.o. female.  Patient here for painful urination for the last few days.  No flank pain.  No history of kidney stones.  Nothing makes it worse or better.  Has been taking some cranberry.  Denies any concern for being pregnant.  No nausea or vomiting.  No abdominal pain.  Having some spasms in her bladder at times.  The history is provided by the patient.       Home Medications Prior to Admission medications   Medication Sig Start Date End Date Taking? Authorizing Provider  dicyclomine (BENTYL) 20 MG tablet Take 1 tablet (20 mg total) by mouth 2 (two) times daily. 04/08/22  Yes Arnell Mausolf, DO  cephALEXin (KEFLEX) 500 MG capsule Take 1 capsule (500 mg total) by mouth 3 (three) times daily for 5 days. 04/08/22 04/13/22  Lennice Sites, DO  HYDROcodone-acetaminophen (NORCO/VICODIN) 5-325 MG tablet Take 1 tablet by mouth every 6 (six) hours as needed. 09/15/20   Blanchie Dessert, MD  omeprazole (PRILOSEC) 20 MG capsule Take 1 capsule (20 mg total) by mouth daily. 09/15/20   Blanchie Dessert, MD  ondansetron (ZOFRAN ODT) 8 MG disintegrating tablet Take 1 tablet (8 mg total) by mouth every 8 (eight) hours as needed for nausea or vomiting. 09/14/20   Pattricia Boss, MD  promethazine (PHENERGAN) 25 MG tablet Take 1 tablet (25 mg total) by mouth every 6 (six) hours as needed for nausea or vomiting. 09/14/20   Pattricia Boss, MD      Allergies    Patient has no known allergies.    Review of Systems   Review of Systems  Physical Exam Updated Vital Signs BP (!) 140/100   Pulse 73   Temp 98.2 F (36.8 C) (Oral)   Resp 16   Ht '5\' 6"'$  (1.676 m)   Wt 97 kg   LMP 08/28/2021   SpO2 98%   Breastfeeding Unknown   BMI 34.53 kg/m  Physical Exam Vitals and nursing note reviewed.   Constitutional:      General: She is not in acute distress.    Appearance: She is well-developed. She is not ill-appearing.  HENT:     Head: Normocephalic and atraumatic.  Eyes:     Conjunctiva/sclera: Conjunctivae normal.     Pupils: Pupils are equal, round, and reactive to light.  Cardiovascular:     Rate and Rhythm: Normal rate and regular rhythm.     Pulses: Normal pulses.     Heart sounds: Normal heart sounds. No murmur heard. Pulmonary:     Effort: Pulmonary effort is normal. No respiratory distress.     Breath sounds: Normal breath sounds.  Abdominal:     Palpations: Abdomen is soft.     Tenderness: There is no abdominal tenderness.  Musculoskeletal:        General: No swelling.     Cervical back: Normal range of motion and neck supple.  Skin:    General: Skin is warm and dry.     Capillary Refill: Capillary refill takes less than 2 seconds.  Neurological:     Mental Status: She is alert.  Psychiatric:        Mood and Affect: Mood normal.     ED Results / Procedures / Treatments   Labs (all  labs ordered are listed, but only abnormal results are displayed) Labs Reviewed  URINALYSIS, ROUTINE W REFLEX MICROSCOPIC - Abnormal; Notable for the following components:      Result Value   APPearance HAZY (*)    Hgb urine dipstick MODERATE (*)    Leukocytes,Ua LARGE (*)    Bacteria, UA MANY (*)    Non Squamous Epithelial 0-5 (*)    All other components within normal limits  PREGNANCY, URINE    EKG None  Radiology No results found.  Procedures Procedures    Medications Ordered in ED Medications - No data to display  ED Course/ Medical Decision Making/ A&P                             Medical Decision Making Amount and/or Complexity of Data Reviewed Labs: ordered.  Risk Prescription drug management.   [name] is here with painful urination.  Normal vitals.  No fever.  Urinalysis consistent with infection.  Pregnancy test is negative.  She has no  flank pain.  No concern for pyelonephritis or other intra-abdominal process.  Very well-appearing.  Will prescribe Bentyl and Keflex.  Discharged in good condition.  This chart was dictated using voice recognition software.  Despite best efforts to proofread,  errors can occur which can change the documentation meaning.         Final Clinical Impression(s) / ED Diagnoses Final diagnoses:  Acute cystitis without hematuria    Rx / DC Orders ED Discharge Orders          Ordered    cephALEXin (KEFLEX) 500 MG capsule  3 times daily        04/08/22 1439    dicyclomine (BENTYL) 20 MG tablet  2 times daily        04/08/22 1439              Lennice Sites, DO 04/08/22 1441

## 2022-06-14 ENCOUNTER — Telehealth: Payer: Self-pay

## 2022-06-14 NOTE — Telephone Encounter (Signed)
Left message to return call to scheduling appointment with NP to establish care. 

## 2022-10-05 ENCOUNTER — Other Ambulatory Visit: Payer: Self-pay

## 2022-10-05 ENCOUNTER — Emergency Department (HOSPITAL_BASED_OUTPATIENT_CLINIC_OR_DEPARTMENT_OTHER)
Admission: EM | Admit: 2022-10-05 | Discharge: 2022-10-05 | Disposition: A | Discharge status: Home / Self Care | Payer: PRIVATE HEALTH INSURANCE | Attending: Emergency Medicine | Admitting: Emergency Medicine

## 2022-10-05 ENCOUNTER — Encounter (HOSPITAL_BASED_OUTPATIENT_CLINIC_OR_DEPARTMENT_OTHER): Payer: Self-pay | Admitting: Emergency Medicine

## 2022-10-05 ENCOUNTER — Emergency Department (HOSPITAL_BASED_OUTPATIENT_CLINIC_OR_DEPARTMENT_OTHER): Payer: PRIVATE HEALTH INSURANCE | Admitting: Radiology

## 2022-10-05 DIAGNOSIS — M25531 Pain in right wrist: Secondary | ICD-10-CM | POA: Insufficient documentation
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
## 2022-10-05 NOTE — Discharge Instructions (Addendum)
Use ibuprofen as directed.  Follow-up with a hand surgeon as needed.  You may also want to wear a wrist brace which you can purchase at a pharmacy
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
## 2022-12-11 ENCOUNTER — Other Ambulatory Visit (HOSPITAL_BASED_OUTPATIENT_CLINIC_OR_DEPARTMENT_OTHER): Payer: Self-pay

## 2022-12-11 ENCOUNTER — Encounter (HOSPITAL_BASED_OUTPATIENT_CLINIC_OR_DEPARTMENT_OTHER): Payer: Self-pay

## 2022-12-11 ENCOUNTER — Emergency Department (HOSPITAL_BASED_OUTPATIENT_CLINIC_OR_DEPARTMENT_OTHER)
Admission: EM | Admit: 2022-12-11 | Discharge: 2022-12-11 | Disposition: A | Discharge status: Home / Self Care | Payer: Medicaid - Out of State | Attending: Emergency Medicine | Admitting: Emergency Medicine

## 2022-12-11 ENCOUNTER — Other Ambulatory Visit: Payer: Self-pay

## 2022-12-11 DIAGNOSIS — Z1152 Encounter for screening for COVID-19: Secondary | ICD-10-CM | POA: Insufficient documentation

## 2022-12-11 DIAGNOSIS — J069 Acute upper respiratory infection, unspecified: Secondary | ICD-10-CM | POA: Insufficient documentation

## 2022-12-11 LAB — RESP PANEL BY RT-PCR (RSV, FLU A&B, COVID)  RVPGX2
Influenza A by PCR: NEGATIVE
Influenza B by PCR: NEGATIVE
Resp Syncytial Virus by PCR: NEGATIVE
SARS Coronavirus 2 by RT PCR: NEGATIVE

## 2022-12-11 MED ORDER — PROMETHAZINE-DM 6.25-15 MG/5ML PO SYRP
5.0000 mL | ORAL_SOLUTION | Freq: Four times a day (QID) | ORAL | 0 refills | Status: AC | PRN
  Filled 2022-12-11: qty 118, 6d supply, fill #0

## 2022-12-11 NOTE — ED Provider Notes (Signed)
[place] EMERGENCY DEPARTMENT AT [facility] Provider Note   CSN: [id number] Arrival date & time: 12/11/22  1454     History  Chief Complaint  Patient presents with   Cough    Emily Mullins is a 32 y.o. female.  Patient with noncontributory past medical history presents today with complaints of cough.  She states that same began 3 days ago and has been persistent since then.  Denies fevers or chills.  No chest pain or shortness of breath.  Daughter at home sick with similar symptoms.  She has tried TheraFlu with minimal relief.  No headache, neck pain, abdominal pain, nausea, or vomiting.  The history is provided by the patient. No language interpreter was used.  Cough      Home Medications Prior to Admission medications   Medication Sig Start Date End Date Taking? Authorizing Provider  dicyclomine (BENTYL) 20 MG tablet Take 1 tablet (20 mg total) by mouth 2 (two) times daily. 04/08/22   Curatolo, Adam, DO  HYDROcodone-acetaminophen (NORCO/VICODIN) 5-325 MG tablet Take 1 tablet by mouth every 6 (six) hours as needed. 09/15/20   Gwyneth Sprout, MD  omeprazole (PRILOSEC) 20 MG capsule Take 1 capsule (20 mg total) by mouth daily. 09/15/20   Gwyneth Sprout, MD  ondansetron (ZOFRAN ODT) 8 MG disintegrating tablet Take 1 tablet (8 mg total) by mouth every 8 (eight) hours as needed for nausea or vomiting. 09/14/20   Margarita Grizzle, MD  promethazine (PHENERGAN) 25 MG tablet Take 1 tablet (25 mg total) by mouth every 6 (six) hours as needed for nausea or vomiting. 09/14/20   Margarita Grizzle, MD      Allergies    Strawberry flavor    Review of Systems   Review of Systems  Respiratory:  Positive for cough.   All other systems reviewed and are negative.   Physical Exam Updated Vital Signs BP (!) 134/106   Pulse 68   Temp 98.5 F (36.9 C) (Oral)   Resp 16   Ht 5\' 6"  (1.676 m)   Wt 97.8 kg   SpO2 100%   BMI 34.80 kg/m  Physical Exam Vitals and nursing note reviewed.   Constitutional:      General: She is not in acute distress.    Appearance: Normal appearance. She is normal weight. She is not ill-appearing, toxic-appearing or diaphoretic.  HENT:     Head: Normocephalic and atraumatic.  Cardiovascular:     Rate and Rhythm: Normal rate and regular rhythm.     Heart sounds: Normal heart sounds.  Pulmonary:     Effort: Pulmonary effort is normal. No respiratory distress.     Breath sounds: Normal breath sounds. No stridor.  Abdominal:     General: Abdomen is flat.     Palpations: Abdomen is soft.     Tenderness: There is no abdominal tenderness.  Musculoskeletal:        General: No tenderness. Normal range of motion.     Cervical back: Normal range of motion and neck supple. No tenderness.     Right lower leg: No edema.     Left lower leg: No edema.  Lymphadenopathy:     Cervical: No cervical adenopathy.  Skin:    General: Skin is warm and dry.  Neurological:     General: No focal deficit present.     Mental Status: She is alert.  Psychiatric:        Mood and Affect: Mood normal.  Behavior: Behavior normal.     ED Results / Procedures / Treatments   Labs (all labs ordered are listed, but only abnormal results are displayed) Labs Reviewed  RESP PANEL BY RT-PCR (RSV, FLU A&B, COVID)  [id number]    EKG None  Radiology No results found.  Procedures Procedures    Medications Ordered in ED Medications - No data to display  ED Course/ Medical Decision Making/ A&P                                 Medical Decision Making  Patient presents today with complaints of cough and congestion x 3 days. She is afebrile,  non-toxic appearing, and in no acute distress with reassuring vital signs. Physical exam reveals lung sounds CTA in all fields. No indication for CXR at this time. Patient swabbed for COVID, flu, and RSV all of which were negative.  Discussed same with patient.  Symptoms likely viral in nature, no antibiotics are needed.   Will send for promethazine cough syrup for symptomatic relief as well as OTC recommendations. Evaluation and diagnostic testing in the emergency department does not suggest an emergent condition requiring admission or immediate intervention beyond what has been performed at this time.  Plan for discharge with close PCP follow-up.  Patient is understanding and amenable with plan, educated on red flag symptoms that would prompt immediate return.  Patient discharged in stable condition.   Final Clinical Impression(s) / ED Diagnoses Final diagnoses:  Viral URI with cough    Rx / DC Orders ED Discharge Orders     None     An After Visit Summary was printed and given to the patient.     Silva Bandy, PA-C 12/11/22 1631    Elayne Snare K, DO 12/11/22 1742

## 2022-12-11 NOTE — ED Triage Notes (Signed)
In for eval for slu-like s/s onset 3 days ago. Non productive cough, chills, nasal congestions.

## 2022-12-11 NOTE — Discharge Instructions (Signed)
Your work-up in the ER today was reassuring for acute findings. You were swabbed for COVID, flu, and RSV which were negative. However, your symptoms are still likely related to an upper respiratory infection. As these are almost always viral in nature, no antibiotics are indicated. I recommend that you get plenty of rest and focus on symptomatic relief which includes Cepacol throat lozenges for sore throat, Mucinex D (orange box) which you can get from behind the counter at your local pharmacy for congestion, and tylenol/ibuprofen as needed for fevers and bodyaches. I have also given you a prescription for promethazine cough syrup which is a cough suppressant medication for you to take as prescribed as needed for management of your symptoms. I also recommend:  Increased fluid intake. Sports drinks offer valuable electrolytes, sugars, and fluids.  Breathing heated mist or steam (vaporizer or shower).  Eating chicken soup or other clear broths, and maintaining good nutrition.   Increasing usage of your inhaler if you have asthma.  Return to work when your temperature has returned to normal.  Gargle warm salt water and spit it out for sore throat. Take benadryl or Zyrtec to decrease sinus secretions.  Follow Up: Follow up with your primary care doctor in 5-7 days for recheck of ongoing symptoms.  Return to emergency department for emergent changing or worsening of symptoms.

## 2022-12-23 ENCOUNTER — Other Ambulatory Visit (HOSPITAL_BASED_OUTPATIENT_CLINIC_OR_DEPARTMENT_OTHER): Payer: Self-pay

## 2023-02-25 ENCOUNTER — Encounter (HOSPITAL_BASED_OUTPATIENT_CLINIC_OR_DEPARTMENT_OTHER): Payer: Self-pay | Admitting: Emergency Medicine

## 2023-02-25 ENCOUNTER — Emergency Department (HOSPITAL_BASED_OUTPATIENT_CLINIC_OR_DEPARTMENT_OTHER): Payer: Self-pay

## 2023-02-25 ENCOUNTER — Other Ambulatory Visit: Payer: Self-pay

## 2023-02-25 ENCOUNTER — Other Ambulatory Visit (HOSPITAL_BASED_OUTPATIENT_CLINIC_OR_DEPARTMENT_OTHER): Payer: Self-pay

## 2023-02-25 ENCOUNTER — Emergency Department (HOSPITAL_BASED_OUTPATIENT_CLINIC_OR_DEPARTMENT_OTHER)
Admission: EM | Admit: 2023-02-25 | Discharge: 2023-02-25 | Disposition: A | Discharge status: Home / Self Care | Payer: Self-pay | Attending: Emergency Medicine | Admitting: Emergency Medicine

## 2023-02-25 DIAGNOSIS — I10 Essential (primary) hypertension: Secondary | ICD-10-CM | POA: Insufficient documentation

## 2023-02-25 DIAGNOSIS — Z3A11 11 weeks gestation of pregnancy: Secondary | ICD-10-CM | POA: Insufficient documentation

## 2023-02-25 DIAGNOSIS — N3001 Acute cystitis with hematuria: Secondary | ICD-10-CM | POA: Insufficient documentation

## 2023-02-25 DIAGNOSIS — O26891 Other specified pregnancy related conditions, first trimester: Secondary | ICD-10-CM | POA: Insufficient documentation

## 2023-02-25 DIAGNOSIS — N939 Abnormal uterine and vaginal bleeding, unspecified: Secondary | ICD-10-CM | POA: Insufficient documentation

## 2023-02-25 LAB — COMPREHENSIVE METABOLIC PANEL
ALT: 9 U/L (ref 0–44)
AST: 18 U/L (ref 15–41)
Albumin: 4.1 g/dL (ref 3.5–5.0)
Alkaline Phosphatase: 40 U/L (ref 38–126)
Anion gap: 12 (ref 5–15)
BUN: 7 mg/dL (ref 6–20)
CO2: 19 mmol/L — ABNORMAL LOW (ref 22–32)
Calcium: 9.4 mg/dL (ref 8.9–10.3)
Chloride: 105 mmol/L (ref 98–111)
Creatinine, Ser: 0.5 mg/dL (ref 0.44–1.00)
GFR, Estimated: >60 mL/min (ref >60)
Glucose, Bld: 88 mg/dL (ref 70–99)
Potassium: 3.5 mmol/L (ref 3.5–5.1)
Sodium: 136 mmol/L (ref 135–145)
Total Bilirubin: 0.5 mg/dL (ref 0.0–1.2)
Total Protein: 7.5 g/dL (ref 6.5–8.1)

## 2023-02-25 LAB — CBC WITH DIFFERENTIAL/PLATELET
Abs Immature Granulocytes: 0.04 10*3/uL (ref 0.00–0.07)
Basophils Absolute: 0.1 10*3/uL (ref 0.0–0.1)
Basophils Relative: 1 %
Eosinophils Absolute: 0.1 10*3/uL (ref 0.0–0.5)
Eosinophils Relative: 1 %
HCT: 36.1 % (ref 36.0–46.0)
Hemoglobin: 13 g/dL (ref 12.0–15.0)
Immature Granulocytes: 0 %
Lymphocytes Relative: 16 %
Lymphs Abs: 1.5 10*3/uL (ref 0.7–4.0)
MCH: 29.1 pg (ref 26.0–34.0)
MCHC: 36 g/dL (ref 30.0–36.0)
MCV: 80.8 fL (ref 80.0–100.0)
Monocytes Absolute: 0.4 10*3/uL (ref 0.1–1.0)
Monocytes Relative: 5 %
Neutro Abs: 7.3 10*3/uL (ref 1.7–7.7)
Neutrophils Relative %: 77 %
Platelets: 226 10*3/uL (ref 150–400)
RBC: 4.47 MIL/uL (ref 3.87–5.11)
RDW: 12.5 % (ref 11.5–15.5)
WBC: 9.4 10*3/uL (ref 4.0–10.5)
nRBC: 0 % (ref 0.0–0.2)

## 2023-02-25 LAB — URINALYSIS, W/ REFLEX TO CULTURE (INFECTION SUSPECTED)
Bilirubin Urine: NEGATIVE
Glucose, UA: NEGATIVE mg/dL
Ketones, ur: NEGATIVE mg/dL
Nitrite: NEGATIVE
Specific Gravity, Urine: 1.023 (ref 1.005–1.030)
pH: 6.5 (ref 5.0–8.0)

## 2023-02-25 LAB — HCG, QUANTITATIVE, PREGNANCY: hCG, Beta Chain, Quant, S: 78853 m[IU]/mL — ABNORMAL HIGH (ref <5)

## 2023-02-25 MED ORDER — CEPHALEXIN 500 MG PO CAPS
500.0000 mg | ORAL_CAPSULE | Freq: Four times a day (QID) | ORAL | 0 refills | Status: AC
  Filled 2023-02-25: qty 28, 7d supply, fill #0

## 2023-02-25 NOTE — Discharge Instructions (Addendum)
 Ultrasound shows you are 11 weeks and 2 days pregnant. Your urine is positive for infection. Take Keflex  4x a day for the next 7 days. Please complete full course of antibiotics to ensure resolution of infection. You can take Tylenol  every 6-8 hours for pain if needed.  I have provided information of OBGYN. Call their office to schedule an appointment to establish care.  Get help right away if you: Have a fever. Have nausea and vomiting. Have back or side pain. Have lower belly pain, tightness, or feel contractions in your uterus. Have a gush of fluid from your vagina. Have blood in your urine.

## 2023-02-25 NOTE — ED Triage Notes (Signed)
 Found out she was pregnant [redacted] weeks ago. Last menstrual end of October.  Today abdo cramping and spotting

## 2023-02-25 NOTE — ED Notes (Addendum)
 Spoke with lab to run CBC and CMP

## 2023-02-25 NOTE — ED Provider Notes (Signed)
 [place] EMERGENCY DEPARTMENT AT [facility] Provider Note   CSN: [id number] Arrival date & time: 02/25/23  1056     History  Chief Complaint  Patient presents with   Vaginal Bleeding    Emily Mullins is a 33 y.o. female with a history of hypertension who presents the ED today for abdominal cramping.  Patient reports that she found out she was pregnant [redacted] weeks ago and has been having intermittent abdominal cramping for the past several days, occurring at different locations of the abdomen, and 2 episodes of vaginal spotting this morning.  Endorses dysuria and nausea and denies any fevers or vomiting.  Last menstrual period was 3 months ago, and the end of October.  She is not currently following with OB/GYN.  She has not been taking a thing for symptoms.  No additional complaints or concerns at this time.    Home Medications Prior to Admission medications   Medication Sig Start Date End Date Taking? Authorizing Provider  cephALEXin  (KEFLEX ) 500 MG capsule Take 1 capsule (500 mg total) by mouth 4 (four) times daily for 7 days. 02/25/23 03/04/23 Yes Waddell Sluder, PA-C  dicyclomine  (BENTYL ) 20 MG tablet Take 1 tablet (20 mg total) by mouth 2 (two) times daily. 04/08/22   Curatolo, Adam, DO  HYDROcodone -acetaminophen  (NORCO/VICODIN) 5-325 MG tablet Take 1 tablet by mouth every 6 (six) hours as needed. 09/15/20   Doretha Folks, MD  omeprazole  (PRILOSEC) 20 MG capsule Take 1 capsule (20 mg total) by mouth daily. 09/15/20   Doretha Folks, MD  ondansetron  (ZOFRAN  ODT) 8 MG disintegrating tablet Take 1 tablet (8 mg total) by mouth every 8 (eight) hours as needed for nausea or vomiting. 09/14/20   Levander Houston, MD  promethazine  (PHENERGAN ) 25 MG tablet Take 1 tablet (25 mg total) by mouth every 6 (six) hours as needed for nausea or vomiting. 09/14/20   Levander Houston, MD  promethazine -dextromethorphan (PROMETHAZINE -DM) 6.25-15 MG/5ML syrup Take 5 mLs by mouth 4 (four) times daily as needed  for cough. 12/11/22   Smoot, Lauraine LABOR, PA-C      Allergies    Strawberry flavoring agent (non-screening)    Review of Systems   Review of Systems  Genitourinary:  Positive for vaginal bleeding.  All other systems reviewed and are negative.   Physical Exam Updated Vital Signs BP 120/85   Pulse 73   Temp 98.1 F (36.7 C)   Resp 16   LMP 12/12/2022 (Approximate)   SpO2 100%  Physical Exam Vitals and nursing note reviewed.  Constitutional:      General: She is not in acute distress.    Appearance: Normal appearance.  HENT:     Head: Normocephalic and atraumatic.     Mouth/Throat:     Mouth: Mucous membranes are moist.  Eyes:     Conjunctiva/sclera: Conjunctivae normal.     Pupils: Pupils are equal, round, and reactive to light.  Cardiovascular:     Rate and Rhythm: Normal rate and regular rhythm.     Pulses: Normal pulses.     Heart sounds: Normal heart sounds.  Pulmonary:     Effort: Pulmonary effort is normal.     Breath sounds: Normal breath sounds.  Abdominal:     Palpations: Abdomen is soft.     Tenderness: There is abdominal tenderness.     Comments: Suprapubic tenderness to palpation  Skin:    General: Skin is warm and dry.     Findings: No rash.  Neurological:  General: No focal deficit present.     Mental Status: She is alert.  Psychiatric:        Mood and Affect: Mood normal.        Behavior: Behavior normal.    ED Results / Procedures / Treatments   Labs (all labs ordered are listed, but only abnormal results are displayed) Labs Reviewed  HCG, QUANTITATIVE, PREGNANCY - Abnormal; Notable for the following components:      Result Value   hCG, Beta Chain, Quant, S 21,146 (*)    All other components within normal limits  URINALYSIS, W/ REFLEX TO CULTURE (INFECTION SUSPECTED) - Abnormal; Notable for the following components:   APPearance HAZY (*)    Hgb urine dipstick MODERATE (*)    Protein, ur TRACE (*)    Leukocytes,Ua SMALL (*)    Bacteria,  UA MANY (*)    All other components within normal limits  COMPREHENSIVE METABOLIC PANEL - Abnormal; Notable for the following components:   CO2 19 (*)    All other components within normal limits  URINE CULTURE  CBC WITH DIFFERENTIAL/PLATELET    EKG None  Radiology US  OB Comp Less 14 Wks Result Date: 02/25/2023 CLINICAL DATA:  [id number] Vaginal bleeding 890711. EXAM: OBSTETRIC <14 WK ULTRASOUND TECHNIQUE: Transabdominal ultrasound was performed for evaluation of the gestation as well as the maternal uterus and adnexal regions. COMPARISON:  None Available. FINDINGS: Intrauterine gestational sac: Single Yolk sac:  Not Visualized. Embryo:  Visualized. Cardiac Activity: Visualized. Heart Rate: 166 bpm CRL:   43.7 mm   11 w 2 d                  US  EDC: 09/14/2023. Subchorionic hemorrhage:  None visualized. Maternal uterus/adnexae: Within normal limits. IMPRESSION: *Single live intrauterine pregnancy with crown-rump length corresponding to 11 weeks 2 days. Electronically Signed   By: Ree Molt M.D.   On: 02/25/2023 14:11    Procedures Procedures: not indicated.   Medications Ordered in ED Medications - No data to display  ED Course/ Medical Decision Making/ A&P                                 Medical Decision Making Amount and/or Complexity of Data Reviewed Labs: ordered. Radiology: ordered.  Risk Prescription drug management.   This patient presents to the ED for concern of vaginal spotting, this involves an extensive number of treatment options, and is a complaint that carries with it a high risk of complications and morbidity.   Differential diagnosis includes: IUP, ectopic pregnancy, UTI, pyelonephritis, etc. Low suspicion for preeclampsia - patient not hypertensive throughout ED visit.   Comorbidities  See HPI above   Additional History  Additional history obtained from prior records.   Lab Tests  I ordered and personally interpreted labs.  The pertinent  results include:   Beta hCG of 78,853 UA shows small leukocytes, negative nitrites, many bacteria, and moderate hemoglobin.  Urine culture pending. CMP and CBC are within normal limits - no acute AKI, electrolyte derangement, infection, or anemia   Imaging Studies  I ordered imaging studies including OB ultrasound <14 weeks  I independently visualized and interpreted imaging which showed: Single live intrauterine pregnancy with crown rump length corresponding to 11 weeks and 2 days. I agree with the radiologist interpretation   Problem List / ED Course / Critical Interventions / Medication Management  Patient found out she was pregnant [redacted]  weeks ago. LMP was 3 months ago. She has been having intermittent abdominal pain in different areas for the past several days with associated dysuria.  She endorses 2 episodes of vaginal spotting today.  Endorses nausea and bilateral low back pain for the past several weeks.  She has not established with OB/GYN. Discussed findings with patient.  Will start her on Keflex  to treat her symptoms especially since she is pregnant. Information for OB/GYN given for patient to follow-up with.   Social Determinants of Health  Access to healthcare   Test / Admission - Considered  Patient is hemodynamically stable and safe discharge home. Return precautions provided.       Final Clinical Impression(s) / ED Diagnoses Final diagnoses:  Acute cystitis with hematuria    Rx / DC Orders ED Discharge Orders          Ordered    cephALEXin  (KEFLEX ) 500 MG capsule  4 times daily        02/25/23 1549              Waddell Sluder, PA-C 02/25/23 1859    Ellouise Richerd POUR, DO 03/03/23 0800

## 2023-02-27 ENCOUNTER — Other Ambulatory Visit (HOSPITAL_BASED_OUTPATIENT_CLINIC_OR_DEPARTMENT_OTHER): Payer: Self-pay

## 2023-02-27 LAB — URINE CULTURE

## 2023-07-15 IMAGING — CT CT ABD-PELV W/ CM
2 of 4 series · 17 of 46 positions shown, 19 images · IV contrast (APPLIED)
Comparison: None.

CLINICAL DATA: Epigastric pain

EXAM:
CT ABDOMEN AND PELVIS WITH CONTRAST
TECHNIQUE: Multidetector CT imaging of the abdomen and pelvis was performed
using the standard protocol following bolus administration of
intravenous contrast.
CONTRAST:  80mL OMNIPAQUE IOHEXOL 300 MG/ML  SOLN

[Series 2: abd pel w · axial · 0.87mm/px · z∈[+752,+1206]mm · 14 of 99 slices shown, 16 images]
[im 4/99  soft-tissue]
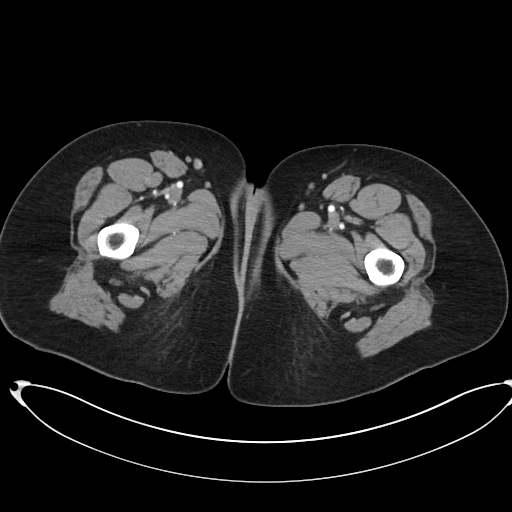
[im 4/99  bone]
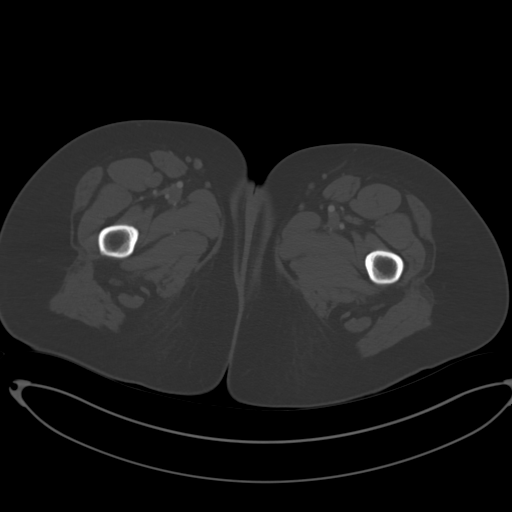
[im 12/99  soft-tissue]
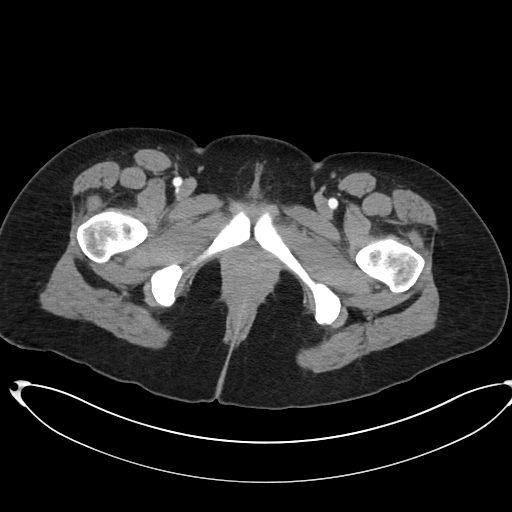
[im 20/99  soft-tissue]
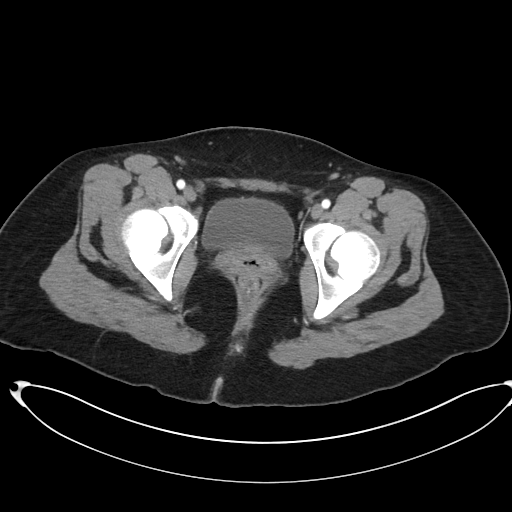
[im 28/99  soft-tissue]
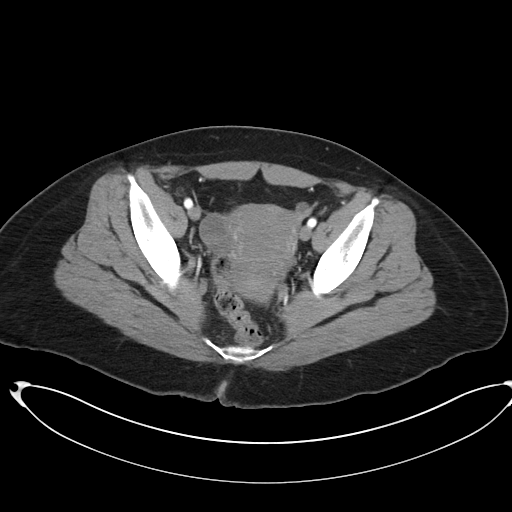
[im 32/99  soft-tissue]
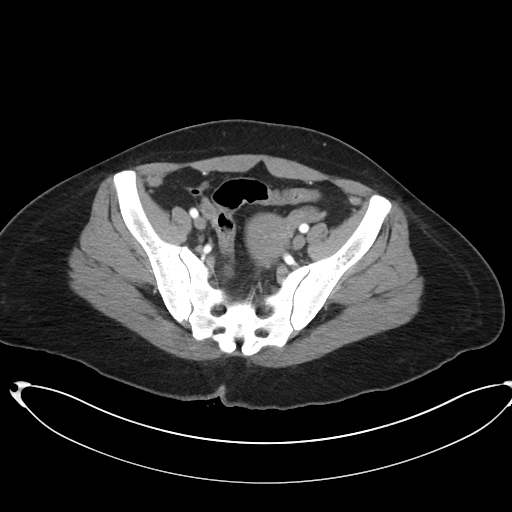
[im 40/99  soft-tissue]
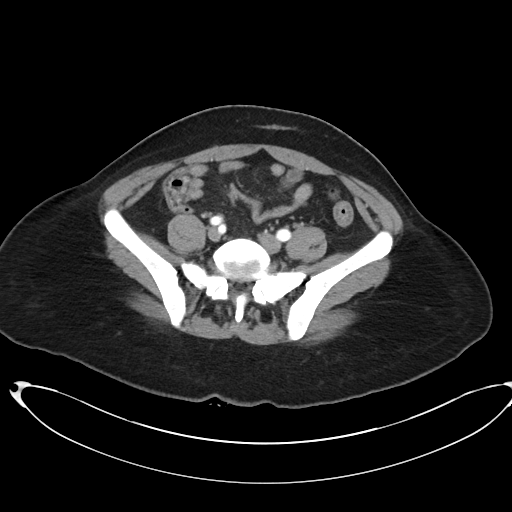
[im 48/99  soft-tissue]
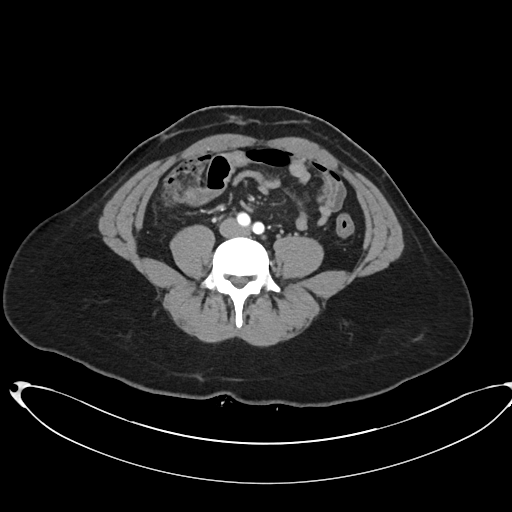
[im 51/99  soft-tissue]
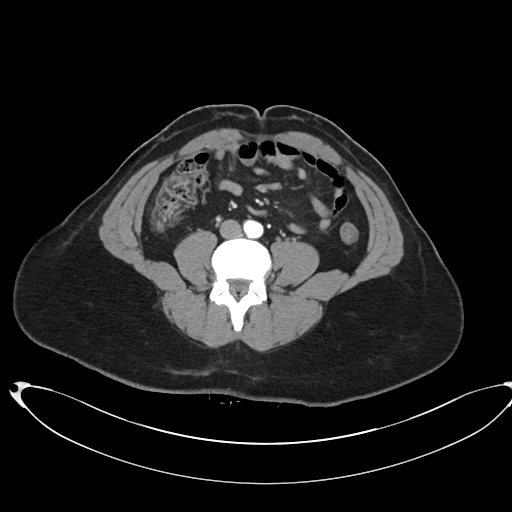
[im 59/99  soft-tissue]
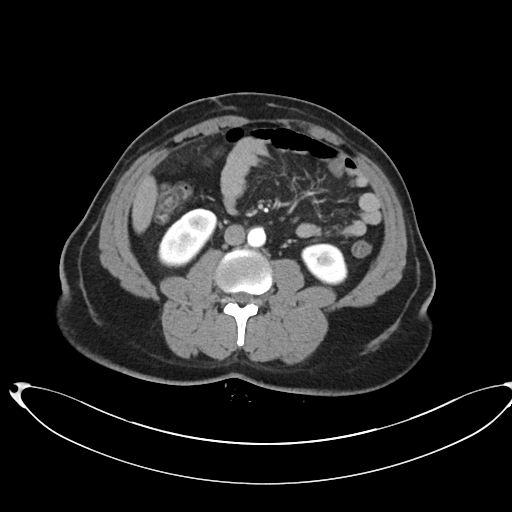
[im 59/99  bone]
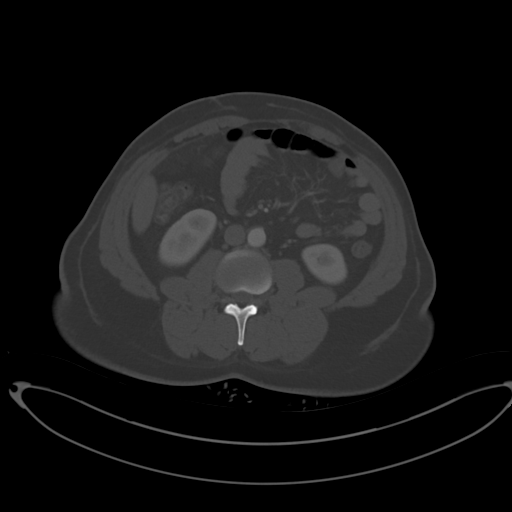
[im 67/99  soft-tissue]
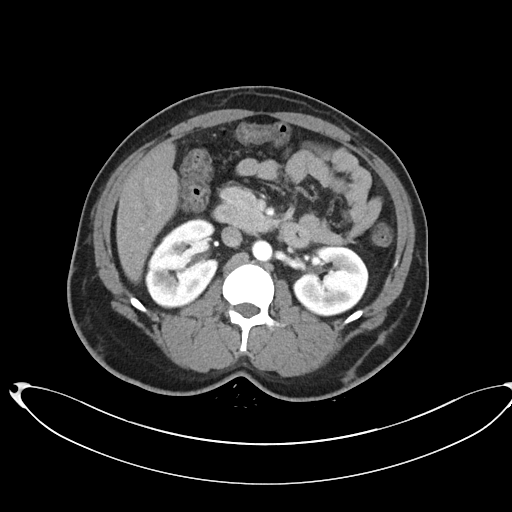
[im 75/99  soft-tissue]
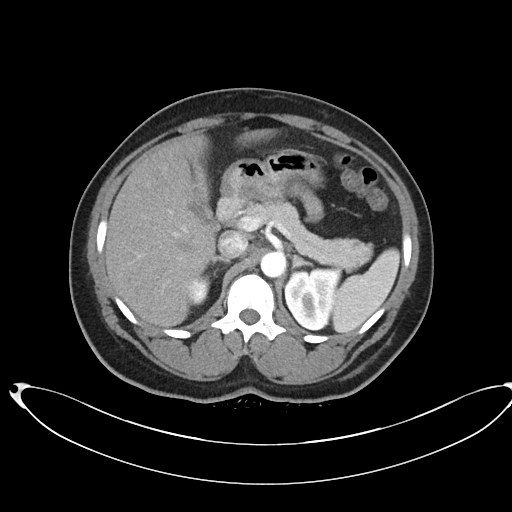
[im 79/99  soft-tissue]
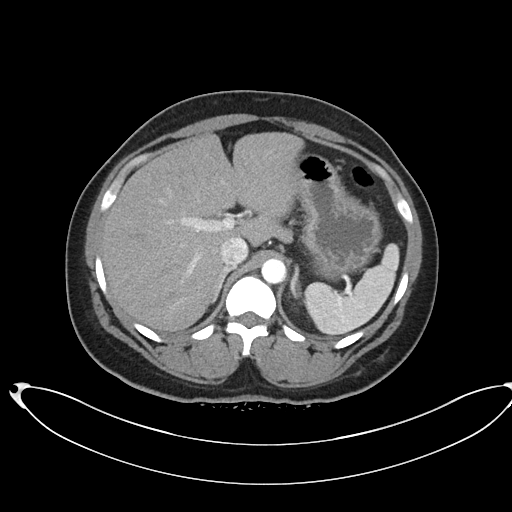
[im 87/99  soft-tissue]
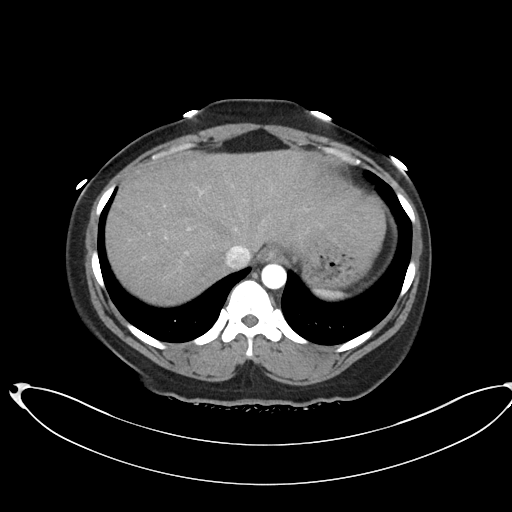
[im 95/99  soft-tissue]
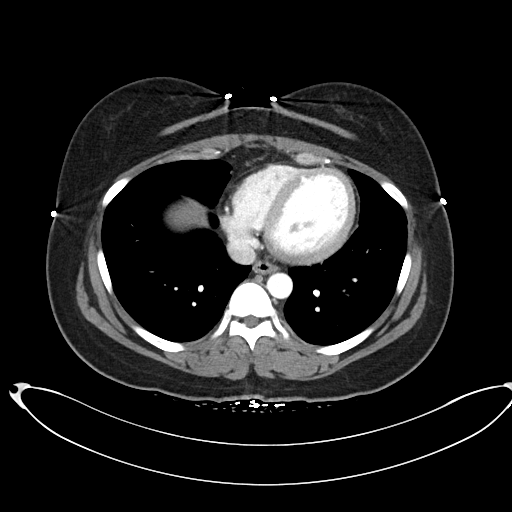

[Series 5: coronal · coronal · 0.86mm/px · 3 of 97 slices shown]
[im 33/97  soft-tissue]
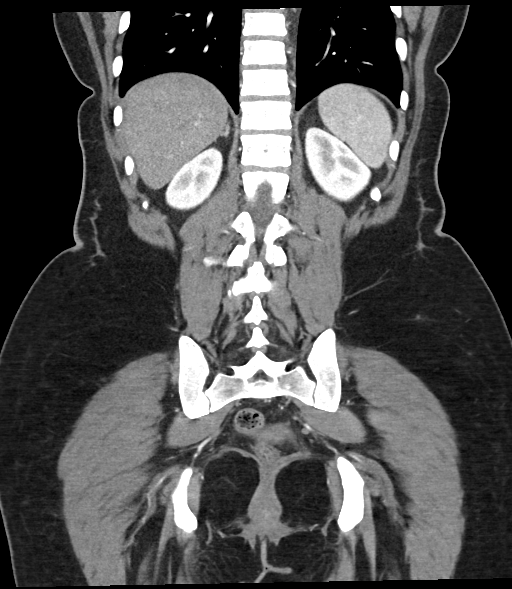
[im 43/97  soft-tissue]
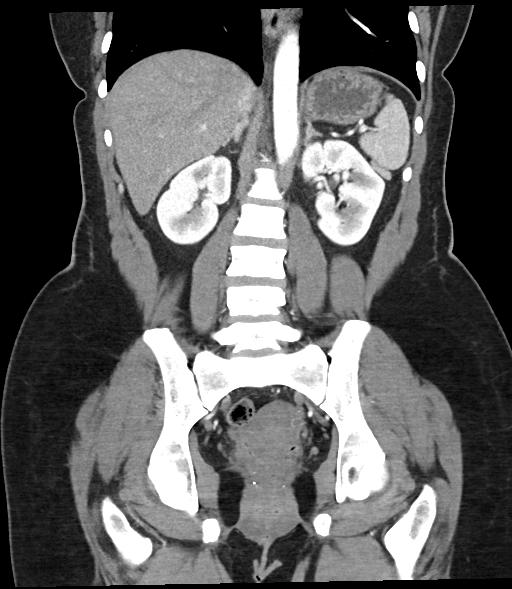
[im 54/97  soft-tissue]
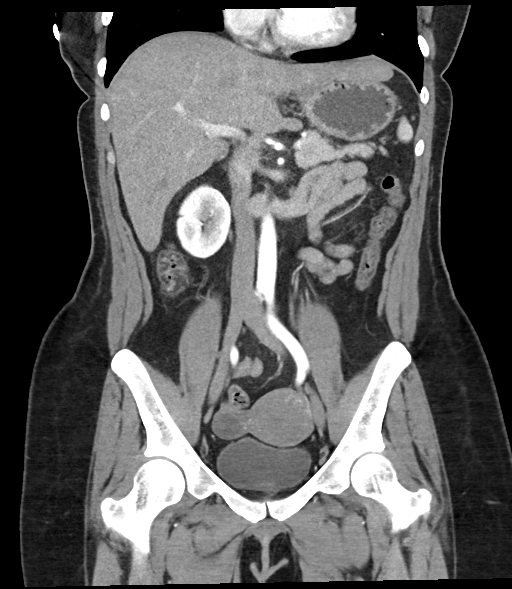

[17 of 46 positions shown; findings below may reference images not displayed]

FINDINGS: Lower chest: Lung bases are clear. No effusions. Heart is normal
size.

Hepatobiliary: No focal hepatic abnormality. Gallbladder
unremarkable.

Pancreas: No focal abnormality or ductal dilatation.

Spleen: No focal abnormality.  Normal size.

Adrenals/Urinary Tract: No adrenal abnormality. No focal renal
abnormality. No stones or hydronephrosis. Urinary bladder is
unremarkable.

Stomach/Bowel: Normal appendix. Stomach, large and small bowel
grossly unremarkable.

Vascular/Lymphatic: No evidence of aneurysm or adenopathy.

Reproductive: Uterus and adnexa unremarkable.  No mass.

Other: No free fluid or free air.

Musculoskeletal: No acute bony abnormality.
IMPRESSION: No acute findings in the abdomen or pelvis.

## 2023-10-29 ENCOUNTER — Emergency Department (HOSPITAL_BASED_OUTPATIENT_CLINIC_OR_DEPARTMENT_OTHER)
Admission: EM | Admit: 2023-10-29 | Discharge: 2023-10-29 | Disposition: A | Discharge status: Home / Self Care | Payer: Self-pay | Attending: Emergency Medicine | Admitting: Emergency Medicine

## 2023-10-29 ENCOUNTER — Other Ambulatory Visit: Payer: Self-pay

## 2023-10-29 DIAGNOSIS — K5900 Constipation, unspecified: Secondary | ICD-10-CM | POA: Insufficient documentation

## 2023-10-29 DIAGNOSIS — E876 Hypokalemia: Secondary | ICD-10-CM | POA: Insufficient documentation

## 2023-10-29 DIAGNOSIS — N12 Tubulo-interstitial nephritis, not specified as acute or chronic: Secondary | ICD-10-CM | POA: Insufficient documentation

## 2023-10-29 DIAGNOSIS — I10 Essential (primary) hypertension: Secondary | ICD-10-CM | POA: Insufficient documentation

## 2023-10-29 DIAGNOSIS — D72829 Elevated white blood cell count, unspecified: Secondary | ICD-10-CM | POA: Insufficient documentation

## 2023-10-29 LAB — CBC WITH DIFFERENTIAL/PLATELET
Abs Immature Granulocytes: 0.04 K/uL (ref 0.00–0.07)
Basophils Absolute: 0.1 K/uL (ref 0.0–0.1)
Basophils Relative: 0 %
Eosinophils Absolute: 0.1 K/uL (ref 0.0–0.5)
Eosinophils Relative: 1 %
HCT: 37.4 % (ref 36.0–46.0)
Hemoglobin: 13.5 g/dL (ref 12.0–15.0)
Immature Granulocytes: 0 %
Lymphocytes Relative: 15 %
Lymphs Abs: 1.9 K/uL (ref 0.7–4.0)
MCH: 30.3 pg (ref 26.0–34.0)
MCHC: 36.1 g/dL — ABNORMAL HIGH (ref 30.0–36.0)
MCV: 83.9 fL (ref 80.0–100.0)
Monocytes Absolute: 0.7 K/uL (ref 0.1–1.0)
Monocytes Relative: 6 %
Neutro Abs: 9.9 K/uL — ABNORMAL HIGH (ref 1.7–7.7)
Neutrophils Relative %: 78 %
Platelets: 296 K/uL (ref 150–400)
RBC: 4.46 MIL/uL (ref 3.87–5.11)
RDW: 12.6 % (ref 11.5–15.5)
WBC: 12.7 K/uL — ABNORMAL HIGH (ref 4.0–10.5)
nRBC: 0 % (ref 0.0–0.2)

## 2023-10-29 LAB — URINALYSIS, ROUTINE W REFLEX MICROSCOPIC
Bilirubin Urine: NEGATIVE
Glucose, UA: NEGATIVE mg/dL
Ketones, ur: NEGATIVE mg/dL
Nitrite: POSITIVE — AB
Protein, ur: NEGATIVE mg/dL
Specific Gravity, Urine: 1.009 (ref 1.005–1.030)
WBC, UA: >50 WBC/hpf (ref 0–5)
pH: 6 (ref 5.0–8.0)

## 2023-10-29 LAB — COMPREHENSIVE METABOLIC PANEL WITH GFR
ALT: 23 U/L (ref 0–44)
AST: 37 U/L (ref 15–41)
Albumin: 4.2 g/dL (ref 3.5–5.0)
Alkaline Phosphatase: 79 U/L (ref 38–126)
Anion gap: 14 (ref 5–15)
BUN: 6 mg/dL (ref 6–20)
CO2: 21 mmol/L — ABNORMAL LOW (ref 22–32)
Calcium: 9.5 mg/dL (ref 8.9–10.3)
Chloride: 101 mmol/L (ref 98–111)
Creatinine, Ser: 0.8 mg/dL (ref 0.44–1.00)
GFR, Estimated: >60 mL/min (ref >60)
Glucose, Bld: 120 mg/dL — ABNORMAL HIGH (ref 70–99)
Potassium: 3.3 mmol/L — ABNORMAL LOW (ref 3.5–5.1)
Sodium: 136 mmol/L (ref 135–145)
Total Bilirubin: 0.8 mg/dL (ref 0.0–1.2)
Total Protein: 7.2 g/dL (ref 6.5–8.1)

## 2023-10-29 LAB — PREGNANCY, URINE: Preg Test, Ur: NEGATIVE

## 2023-10-29 LAB — LIPASE, BLOOD: Lipase: 26 U/L (ref 11–51)

## 2023-10-29 MED ORDER — POTASSIUM CHLORIDE CRYS ER 20 MEQ PO TBCR
40.0000 meq | EXTENDED_RELEASE_TABLET | Freq: Once | ORAL | Status: AC
  Administered 2023-10-29: 40 meq via ORAL
  Filled 2023-10-29: qty 2

## 2023-10-29 MED ORDER — SULFAMETHOXAZOLE-TRIMETHOPRIM 800-160 MG PO TABS
1.0000 | ORAL_TABLET | Freq: Two times a day (BID) | ORAL | 0 refills | Status: AC
Start: 2023-10-30 — End: 2023-11-09

## 2023-10-29 MED ORDER — CEFTRIAXONE SODIUM 1 G IJ SOLR
1.0000 g | Freq: Once | INTRAMUSCULAR | Status: AC
  Administered 2023-10-29: 1 g via INTRAVENOUS
  Filled 2023-10-29: qty 10

## 2023-10-29 MED ORDER — PHENAZOPYRIDINE HCL 100 MG PO TABS
200.0000 mg | ORAL_TABLET | Freq: Once | ORAL | Status: AC
  Administered 2023-10-29: 200 mg via ORAL
  Filled 2023-10-29: qty 2

## 2023-10-29 NOTE — ED Provider Notes (Signed)
 [place] EMERGENCY DEPARTMENT AT [facility] Provider Note   CSN: [id number] Arrival date & time: 10/29/23  9066     Patient presents with: Dysuria   Emily Mullins is a 33 y.o. female with history of hypertension, presents with concern for lower abdominal pressure and dysuria ongoing for the past 3 days.  Denies hematuria or increased frequency. She states pain also sometimes radiates around the sides into her lower back.  She denies fever but reports chills.  She also reports some intermittent vomiting over the past couple of weeks.  She tends to throw up the food she has in her stomach, emesis is nonbloody and nonbilious.  She does report issues with constipation, but reports last bowel movement was yesterday.  Reports still passing gas.    Dysuria      Prior to Admission medications   Medication Sig Start Date End Date Taking? Authorizing Provider  sulfamethoxazole -trimethoprim  (BACTRIM  DS) 800-160 MG tablet Take 1 tablet by mouth 2 (two) times daily for 10 days. 10/30/23 11/09/23 Yes Veta Palma, PA-C  dicyclomine  (BENTYL ) 20 MG tablet Take 1 tablet (20 mg total) by mouth 2 (two) times daily. 04/08/22   Curatolo, Adam, DO  HYDROcodone -acetaminophen  (NORCO/VICODIN) 5-325 MG tablet Take 1 tablet by mouth every 6 (six) hours as needed. 09/15/20   Doretha Folks, MD  omeprazole  (PRILOSEC) 20 MG capsule Take 1 capsule (20 mg total) by mouth daily. 09/15/20   Doretha Folks, MD  ondansetron  (ZOFRAN  ODT) 8 MG disintegrating tablet Take 1 tablet (8 mg total) by mouth every 8 (eight) hours as needed for nausea or vomiting. 09/14/20   Levander Houston, MD  promethazine  (PHENERGAN ) 25 MG tablet Take 1 tablet (25 mg total) by mouth every 6 (six) hours as needed for nausea or vomiting. 09/14/20   Levander Houston, MD  promethazine -dextromethorphan (PROMETHAZINE -DM) 6.25-15 MG/5ML syrup Take 5 mLs by mouth 4 (four) times daily as needed for cough. 12/11/22   Smoot, Lauraine LABOR, PA-C     Allergies: Strawberry flavoring agent (non-screening)    Review of Systems  Genitourinary:  Positive for dysuria.    Updated Vital Signs BP (!) 150/93   Pulse 86   Temp 97.9 F (36.6 C) (Oral)   Resp 18   LMP 12/12/2022 (Approximate)   SpO2 100%   Physical Exam Vitals and nursing note reviewed.  Constitutional:      Appearance: Normal appearance.     Comments: No active vomiting  HENT:     Head: Atraumatic.  Cardiovascular:     Rate and Rhythm: Normal rate and regular rhythm.  Pulmonary:     Effort: Pulmonary effort is normal.     Breath sounds: Normal breath sounds.  Abdominal:     Palpations: Abdomen is soft.     Tenderness: There is abdominal tenderness.     Comments: Mild tenderness diffusely of the abdomen, no rebound or gaurding  Left-sided CVA tenderness to palpation, no right-sided CVA tenderness  Skin:    General: Skin is warm and dry.  Neurological:     General: No focal deficit present.     Mental Status: She is alert.  Psychiatric:        Mood and Affect: Mood normal.        Behavior: Behavior normal.     (all labs ordered are listed, but only abnormal results are displayed) Labs Reviewed  URINALYSIS, ROUTINE W REFLEX MICROSCOPIC - Abnormal; Notable for the following components:      Result Value   Hgb  urine dipstick LARGE (*)    Nitrite POSITIVE (*)    Leukocytes,Ua LARGE (*)    Bacteria, UA MANY (*)    All other components within normal limits  CBC WITH DIFFERENTIAL/PLATELET - Abnormal; Notable for the following components:   WBC 12.7 (*)    MCHC 36.1 (*)    Neutro Abs 9.9 (*)    All other components within normal limits  COMPREHENSIVE METABOLIC PANEL WITH GFR - Abnormal; Notable for the following components:   Potassium 3.3 (*)    CO2 21 (*)    Glucose, Bld 120 (*)    All other components within normal limits  URINE CULTURE  PREGNANCY, URINE  LIPASE, BLOOD    EKG: None  Radiology: No results found.   Procedures    Medications Ordered in the ED  potassium chloride  SA (KLOR-CON  M) CR tablet 40 mEq (has no administration in time range)  cefTRIAXone  (ROCEPHIN ) 1 g in sodium chloride  0.9 % 100 mL IVPB (0 g Intravenous Stopped 10/29/23 1137)  phenazopyridine  (PYRIDIUM ) tablet 200 mg (200 mg Oral Given 10/29/23 1059)                                    Medical Decision Making Amount and/or Complexity of Data Reviewed Labs: ordered.  Risk Prescription drug management.     Differential diagnosis includes but is not limited to Cannabinoid hyperemesis syndrome, acute cholecystitis, cholelithiasis, cholangitis, choledocholithiasis, peptic ulcer, gastritis, gastroenteritis, appendicitis, IBS, IBD, DKA, nephrolithiasis, UTI, pyelonephritis, pancreatitis, diverticulitis, mesenteric ischemia, abdominal aortic aneurysm, small bowel obstruction, volvulus, ovarian torsion and pregnancy related concerns in females of childbearing age    ED Course:  Upon initial evaluation, patient is well-appearing, no acute distress.  Stable vitals aside from elevated blood pressure 153/98.  She does have mild abdominal tenderness to palpation diffusely, without rebound or guarding.  No active vomiting.  Labs Ordered: I Ordered, and personally interpreted labs.  The pertinent results include:   CBC with leukocytosis of 12.7 CMP with hypokalemia 3.3, no elevation in LFTs or creatinine Lipase within normal limits Urinalysis with positive nitrites and leukocytes, appears to be a clean-catch  Medications Given: Ceftriaxone   Pyridium  40 mEq KCl  Upon re-evaluation, patient remains well-appearing with stable vitals aside from her elevated blood pressure.  Her urine has large amount of leukocytes, positive for nitrites.  Does appear to be a clean-catch.  Given her symptoms of dysuria, suprapubic pain, and some flank pain, this is concerning for UTI and pyelonephritis.  Pregnancy test negative.  Will give dose of ceftriaxone  here  and continue treatment at home with course of Bactrim .  Her labs are reassuring.  No elevations in creatinine, LFTs, lipase.  She does have a leukocytosis of 12.7, but no other signs of systemic infection such as fever, tachycardia.  This is likely from her pyelonephritis.  She does report some intermittent vomiting over the past couple weeks, unclear cause.  No active vomiting today.  Low concern for small bowel obstruction given she is tolerating p.o. intake, no active vomiting, still passing gas and having bowel movements.  Do not feel imaging is needed at this time, but I did talk to patient that if she has any worsening symptoms such as persistent vomiting, no longer passing gas or stool, worsening abdominal pain, she needs to return immediately to the emergency room.  She is tolerating PO intake, that she is appropriate for outpatient therapy at  this time.  Stable and appropriate for discharge home.    Impression: Pyelonephritis Hypokalemia  Disposition:  The patient was discharged home with instructions to take 10-day course of Bactrim  starting tomorrow.  Follow-up with PCP if symptoms not improving within the next 5 days.  She understands that her blood pressure was elevated here today and she needs to follow-up with her PCP within the next month regarding this for further management. Return precautions given.     This chart was dictated using voice recognition software, Dragon. Despite the best efforts of this provider to proofread and correct errors, errors may still occur which can change documentation meaning.       Final diagnoses:  Pyelonephritis    ED Discharge Orders          Ordered    sulfamethoxazole -trimethoprim  (BACTRIM  DS) 800-160 MG tablet  2 times daily        10/29/23 1121               Veta Palma, PA-C 10/29/23 1143    Emil Share, DO 10/29/23 1145

## 2023-10-29 NOTE — ED Notes (Signed)
 Pt given discharge instructions and reviewed prescriptions. Opportunities given for questions. Pt verbalizes understanding. PIV removed x1. Bethena Powell SAUNDERS, RN

## 2023-10-29 NOTE — Discharge Instructions (Addendum)
 You were found to have a urinary tract infection that is also involving your kidneys. This is called pyelonephritis. Your urine has been sent off for culture to see what bacteria is in your urine. If your antibiotic needs to be changed, you will be contacted.  The remainder of your labs are reassuring today.  Your kidney, liver, and pancreas labs are normal.  Your blood work showed an elevated white blood cell count which is consistent with the infection you are having.This should return to normal once your infection resolves.   You were found to have a slightly low potassium level on your labs today.  You were given a potassium supplement here today to help bring this back up.  Please increase your dietary intake of potassium with foods such as avocados, potatoes, bananas, spinach, salmon. Please have your PCP monitor this value.   Your blood pressure was elevated here today at 150/93. Aim to get 30 minutes of exercise daily and limit intake of processed/fast foods. Please take and record your blood pressures at home. Take them at the same time every day. Follow up with your PCP within the next month to discuss if you may need treatment for your blood pressure.   Medications: You have been prescribed an antibiotic called trimethoprim /sulfamethoxazole  (Bactrim ). Take this antibiotic 2 times a day for the next 10 days starting tomorrow morning.  Take the full course of your antibiotic even if you start feeling better. Antibiotics may cause you to have diarrhea.  Follow-up instructions: Please follow-up with your primary care provider if symptoms are not improving within the next 5 days.  Return instructions:  Please return to the Emergency Department if you: Develop confusion or become poorly responsive or faint Develop a fever above 100.62F Worsening pain You have persistent vomiting and/or are unable to keep medications down Please return if you have any other emergent concerns.

## 2023-10-29 NOTE — ED Triage Notes (Signed)
 C/o dysuria and pressure x 3 days. Denies fevers.

## 2023-10-31 LAB — URINE CULTURE: Culture: >=100000 — AB

## 2023-11-01 ENCOUNTER — Telehealth (HOSPITAL_BASED_OUTPATIENT_CLINIC_OR_DEPARTMENT_OTHER): Payer: Self-pay

## 2023-11-01 NOTE — Telephone Encounter (Signed)
 Post ED Visit - Positive Culture Follow-up: Unsuccessful Patient Follow-up  Culture assessed and recommendations reviewed by:  [x]  Dorn Poot, Pharm.D. []  Venetia Gully, Pharm.D., BCPS AQ-ID []  Garrel Crews, Pharm.D., BCPS []  Almarie Lunger, Pharm.D., BCPS []  [place], [address].D., BCPS, AAHIVP []  Rosaline Bihari, Pharm.D., BCPS, AAHIVP []  Massie Rigg, PharmD []  Jodie Rower, PharmD, BCPS  Positive urine culture  []  Patient discharged without antimicrobial prescription and treatment is now indicated [x]  Organism is resistant to prescribed ED discharge antimicrobial []  Patient with positive blood cultures  Plan: Stop Bactrim  and start Cefadroxil 1000 mg po BID x 7 days per ED provider Emily Showers, Emily Mullins   Unable to contact patient after 3 attempts, letter will be sent to address on file  [name] 11/01/2023, 11:24 AM

## 2023-11-01 NOTE — Progress Notes (Signed)
 ED Antimicrobial Stewardship Positive Culture Follow Up   Emily Mullins is an 33 y.o. female who presented to [facility] on 10/29/2023 with a chief complaint of  Chief Complaint  Patient presents with   Dysuria    Recent Results (from the past 720 hours)  Urine Culture     Status: Abnormal   Collection Time: 10/29/23 10:01 AM   Specimen: Urine, Clean Catch  Result Value Ref Range Status   Specimen Description   Final    URINE, CLEAN CATCH Performed at [facility], [address] [place], [place] [address]    Special Requests   Final    NONE Performed at [facility], [address] [place], [place] [address]    Culture >=100,000 COLONIES/mL ESCHERICHIA COLI (A)  Final   Report Status 10/31/2023 FINAL  Final   Organism ID, Bacteria ESCHERICHIA COLI (A)  Final      Susceptibility   Escherichia coli - MIC*    AMPICILLIN >=32 RESISTANT Resistant     CEFAZOLIN (URINE) Value in next row Sensitive      4 SENSITIVEThis is a modified FDA-approved test that has been validated and its performance characteristics determined by the reporting laboratory.  This laboratory is certified under the Clinical Laboratory Improvement Amendments CLIA as qualified to perform high complexity clinical laboratory testing.    CEFEPIME Value in next row Sensitive      4 SENSITIVEThis is a modified FDA-approved test that has been validated and its performance characteristics determined by the reporting laboratory.  This laboratory is certified under the Clinical Laboratory Improvement Amendments CLIA as qualified to perform high complexity clinical laboratory testing.    ERTAPENEM Value in next row Sensitive      4 SENSITIVEThis is a modified FDA-approved test that has been validated and its performance characteristics determined by the reporting laboratory.  This laboratory is certified under the Clinical Laboratory Improvement Amendments CLIA as qualified to perform high  complexity clinical laboratory testing.    CEFTRIAXONE  Value in next row Sensitive      4 SENSITIVEThis is a modified FDA-approved test that has been validated and its performance characteristics determined by the reporting laboratory.  This laboratory is certified under the Clinical Laboratory Improvement Amendments CLIA as qualified to perform high complexity clinical laboratory testing.    CIPROFLOXACIN Value in next row Sensitive      4 SENSITIVEThis is a modified FDA-approved test that has been validated and its performance characteristics determined by the reporting laboratory.  This laboratory is certified under the Clinical Laboratory Improvement Amendments CLIA as qualified to perform high complexity clinical laboratory testing.    GENTAMICIN Value in next row Resistant      4 SENSITIVEThis is a modified FDA-approved test that has been validated and its performance characteristics determined by the reporting laboratory.  This laboratory is certified under the Clinical Laboratory Improvement Amendments CLIA as qualified to perform high complexity clinical laboratory testing.    NITROFURANTOIN Value in next row Sensitive      4 SENSITIVEThis is a modified FDA-approved test that has been validated and its performance characteristics determined by the reporting laboratory.  This laboratory is certified under the Clinical Laboratory Improvement Amendments CLIA as qualified to perform high complexity clinical laboratory testing.    TRIMETH /SULFA  Value in next row Resistant      4 SENSITIVEThis is a modified FDA-approved test that has been validated and its performance characteristics determined by the reporting laboratory.  This laboratory is certified  under the Clinical Laboratory Improvement Amendments CLIA as qualified to perform high complexity clinical laboratory testing.    AMPICILLIN/SULBACTAM Value in next row Intermediate      4 SENSITIVEThis is a modified FDA-approved test that has been  validated and its performance characteristics determined by the reporting laboratory.  This laboratory is certified under the Clinical Laboratory Improvement Amendments CLIA as qualified to perform high complexity clinical laboratory testing.    PIP/TAZO Value in next row Sensitive      <=4 SENSITIVEThis is a modified FDA-approved test that has been validated and its performance characteristics determined by the reporting laboratory.  This laboratory is certified under the Clinical Laboratory Improvement Amendments CLIA as qualified to perform high complexity clinical laboratory testing.    MEROPENEM Value in next row Sensitive      <=4 SENSITIVEThis is a modified FDA-approved test that has been validated and its performance characteristics determined by the reporting laboratory.  This laboratory is certified under the Clinical Laboratory Improvement Amendments CLIA as qualified to perform high complexity clinical laboratory testing.    * >=100,000 COLONIES/mL ESCHERICHIA COLI    [x]  Treated with Bactrim , organism resistant to prescribed antimicrobial []  Patient discharged originally without antimicrobial agent and treatment is now indicated  New antibiotic prescription: Cefadroxil  ED Provider: Darice Showers, PA-C   Dorn Poot 11/01/2023, 10:11 AM Clinical Pharmacist Monday - Friday phone -  [phone] Saturday - Sunday phone - [phone]

## 2024-02-03 ENCOUNTER — Other Ambulatory Visit: Payer: Self-pay

## 2024-02-03 ENCOUNTER — Other Ambulatory Visit (HOSPITAL_BASED_OUTPATIENT_CLINIC_OR_DEPARTMENT_OTHER): Payer: Self-pay

## 2024-02-03 ENCOUNTER — Encounter (HOSPITAL_BASED_OUTPATIENT_CLINIC_OR_DEPARTMENT_OTHER): Payer: Self-pay | Admitting: *Deleted

## 2024-02-03 ENCOUNTER — Emergency Department (HOSPITAL_BASED_OUTPATIENT_CLINIC_OR_DEPARTMENT_OTHER)
Admission: EM | Admit: 2024-02-03 | Discharge: 2024-02-03 | Disposition: A | Discharge status: Home / Self Care | Payer: Self-pay | Attending: Emergency Medicine | Admitting: Emergency Medicine

## 2024-02-03 DIAGNOSIS — N390 Urinary tract infection, site not specified: Secondary | ICD-10-CM | POA: Insufficient documentation

## 2024-02-03 LAB — URINALYSIS, ROUTINE W REFLEX MICROSCOPIC
Bilirubin Urine: NEGATIVE
Glucose, UA: NEGATIVE mg/dL
Ketones, ur: NEGATIVE mg/dL
Nitrite: POSITIVE — AB
Protein, ur: 100 mg/dL — AB
Specific Gravity, Urine: <1.005 — ABNORMAL LOW (ref 1.005–1.030)
WBC, UA: >50 WBC/hpf (ref 0–5)
pH: 6.5 (ref 5.0–8.0)

## 2024-02-03 LAB — PREGNANCY, URINE: Preg Test, Ur: NEGATIVE

## 2024-02-03 MED ORDER — CEPHALEXIN 500 MG PO CAPS
500.0000 mg | ORAL_CAPSULE | Freq: Three times a day (TID) | ORAL | 0 refills | Status: DC
  Filled 2024-02-03: qty 21, 7d supply, fill #0

## 2024-02-03 MED ORDER — CEPHALEXIN 500 MG PO CAPS: 500.0000 mg | ORAL_CAPSULE | Freq: Three times a day (TID) | ORAL | 0 refills | Status: AC

## 2024-02-03 MED ORDER — HYDROCODONE-ACETAMINOPHEN 5-325 MG PO TABS: 1.0000 | ORAL_TABLET | Freq: Four times a day (QID) | ORAL | 0 refills | Status: DC | PRN

## 2024-02-03 NOTE — Discharge Instructions (Signed)
 Begin taking Keflex  as prescribed.  Drink plenty of fluids and get plenty of rest.  Return to the ER if symptoms significantly worsen or change.

## 2024-02-03 NOTE — ED Triage Notes (Signed)
 Pt reports painful urination, urinary frequency. Bilateral flank pain. Nausea. Denies fevers. Taking cranberry pills and OTC urinary pain relief.

## 2024-02-03 NOTE — ED Provider Notes (Signed)
 " [place] EMERGENCY DEPARTMENT AT [facility] Provider Note   CSN: [id number] Arrival date & time: 02/03/24  9468     Patient presents with: Urinary Frequency   Emily Mullins is a 33 y.o. female.   Patient is a 33 year old female presenting with suprapubic discomfort and dysuria.  She was diagnosed with a UTI several months ago and believes that it is back.  She denies any fevers or chills.  No bowel complaints.       Prior to Admission medications  Medication Sig Start Date End Date Taking? Authorizing Provider  dicyclomine  (BENTYL ) 20 MG tablet Take 1 tablet (20 mg total) by mouth 2 (two) times daily. 04/08/22   Curatolo, Adam, DO  HYDROcodone -acetaminophen  (NORCO/VICODIN) 5-325 MG tablet Take 1 tablet by mouth every 6 (six) hours as needed. 09/15/20   Doretha Folks, MD  omeprazole  (PRILOSEC) 20 MG capsule Take 1 capsule (20 mg total) by mouth daily. 09/15/20   Doretha Folks, MD  ondansetron  (ZOFRAN  ODT) 8 MG disintegrating tablet Take 1 tablet (8 mg total) by mouth every 8 (eight) hours as needed for nausea or vomiting. 09/14/20   Levander Houston, MD  promethazine  (PHENERGAN ) 25 MG tablet Take 1 tablet (25 mg total) by mouth every 6 (six) hours as needed for nausea or vomiting. 09/14/20   Levander Houston, MD  promethazine -dextromethorphan (PROMETHAZINE -DM) 6.25-15 MG/5ML syrup Take 5 mLs by mouth 4 (four) times daily as needed for cough. 12/11/22   Smoot, Lauraine LABOR, PA-C    Allergies: Strawberry flavoring agent (non-screening)    Review of Systems  All other systems reviewed and are negative.   Updated Vital Signs BP (!) 172/115 (BP Location: Right Arm)   Pulse 92   Temp 98.4 F (36.9 C) (Oral)   Resp 15   LMP 01/20/2024   SpO2 96%   Physical Exam Vitals and nursing note reviewed.  Constitutional:      Appearance: Normal appearance.  Pulmonary:     Effort: Pulmonary effort is normal.  Abdominal:     General: Abdomen is flat. There is no distension.      Tenderness: There is abdominal tenderness. There is no guarding or rebound.     Comments: There is mild suprapubic tenderness.  Skin:    General: Skin is warm and dry.  Neurological:     Mental Status: She is alert and oriented to person, place, and time.     (all labs ordered are listed, but only abnormal results are displayed) Labs Reviewed  URINALYSIS, ROUTINE W REFLEX MICROSCOPIC - Abnormal; Notable for the following components:      Result Value   APPearance HAZY (*)    Specific Gravity, Urine <1.005 (*)    Hgb urine dipstick LARGE (*)    Protein, ur 100 (*)    Nitrite POSITIVE (*)    Leukocytes,Ua LARGE (*)    Bacteria, UA RARE (*)    Non Squamous Epithelial 0-5 (*)    Crystals PRESENT (*)    All other components within normal limits  PREGNANCY, URINE    EKG: None  Radiology: No results found.   Procedures   Medications Ordered in the ED - No data to display                                  Medical Decision Making Amount and/or Complexity of Data Reviewed Labs: ordered.   Urinalysis consistent with UTI.  Patient to  be treated with Keflex  and follow-up as needed.     Final diagnoses:  None    ED Discharge Orders     None          Geroldine Berg, MD 02/03/24 [phone]  "

## 2024-03-02 ENCOUNTER — Emergency Department (HOSPITAL_BASED_OUTPATIENT_CLINIC_OR_DEPARTMENT_OTHER): Payer: Self-pay

## 2024-03-02 ENCOUNTER — Other Ambulatory Visit: Payer: Self-pay

## 2024-03-02 ENCOUNTER — Emergency Department (HOSPITAL_BASED_OUTPATIENT_CLINIC_OR_DEPARTMENT_OTHER)
Admission: EM | Admit: 2024-03-02 | Discharge: 2024-03-02 | Disposition: A | Discharge status: Home / Self Care | Payer: Self-pay | Attending: Emergency Medicine | Admitting: Emergency Medicine

## 2024-03-02 ENCOUNTER — Encounter (HOSPITAL_BASED_OUTPATIENT_CLINIC_OR_DEPARTMENT_OTHER): Payer: Self-pay

## 2024-03-02 DIAGNOSIS — D72829 Elevated white blood cell count, unspecified: Secondary | ICD-10-CM | POA: Insufficient documentation

## 2024-03-02 DIAGNOSIS — N3001 Acute cystitis with hematuria: Secondary | ICD-10-CM | POA: Insufficient documentation

## 2024-03-02 LAB — CBC WITH DIFFERENTIAL/PLATELET
Abs Immature Granulocytes: 0.06 K/uL (ref 0.00–0.07)
Basophils Absolute: 0.1 K/uL (ref 0.0–0.1)
Basophils Relative: 1 %
Eosinophils Absolute: 0.2 K/uL (ref 0.0–0.5)
Eosinophils Relative: 1 %
HCT: 37.4 % (ref 36.0–46.0)
Hemoglobin: 13 g/dL (ref 12.0–15.0)
Immature Granulocytes: 0 %
Lymphocytes Relative: 13 %
Lymphs Abs: 1.7 K/uL (ref 0.7–4.0)
MCH: 29.5 pg (ref 26.0–34.0)
MCHC: 34.8 g/dL (ref 30.0–36.0)
MCV: 84.8 fL (ref 80.0–100.0)
Monocytes Absolute: 0.7 K/uL (ref 0.1–1.0)
Monocytes Relative: 5 %
Neutro Abs: 11 K/uL — ABNORMAL HIGH (ref 1.7–7.7)
Neutrophils Relative %: 80 %
Platelets: 280 K/uL (ref 150–400)
RBC: 4.41 MIL/uL (ref 3.87–5.11)
RDW: 12.9 % (ref 11.5–15.5)
WBC: 13.7 K/uL — ABNORMAL HIGH (ref 4.0–10.5)
nRBC: 0 % (ref 0.0–0.2)

## 2024-03-02 LAB — BASIC METABOLIC PANEL WITH GFR
Anion gap: 12 (ref 5–15)
BUN: 15 mg/dL (ref 6–20)
CO2: 24 mmol/L (ref 22–32)
Calcium: 8.9 mg/dL (ref 8.9–10.3)
Chloride: 102 mmol/L (ref 98–111)
Creatinine, Ser: 0.67 mg/dL (ref 0.44–1.00)
GFR, Estimated: >60 mL/min
Glucose, Bld: 86 mg/dL (ref 70–99)
Potassium: 3.6 mmol/L (ref 3.5–5.1)
Sodium: 139 mmol/L (ref 135–145)

## 2024-03-02 LAB — URINALYSIS, ROUTINE W REFLEX MICROSCOPIC
Bacteria, UA: NONE SEEN
Bilirubin Urine: NEGATIVE
Glucose, UA: NEGATIVE mg/dL
Ketones, ur: NEGATIVE mg/dL
Nitrite: NEGATIVE
Protein, ur: 100 mg/dL — AB
RBC / HPF: >50 RBC/hpf (ref 0–5)
Specific Gravity, Urine: 1.02 (ref 1.005–1.030)
WBC, UA: >50 WBC/hpf (ref 0–5)
pH: 6.5 (ref 5.0–8.0)

## 2024-03-02 LAB — PREGNANCY, URINE: Preg Test, Ur: NEGATIVE

## 2024-03-02 MED ORDER — IOHEXOL 300 MG/ML  SOLN
100.0000 mL | Freq: Once | INTRAMUSCULAR | Status: AC | PRN
  Administered 2024-03-02: 100 mL via INTRAVENOUS

## 2024-03-02 MED ORDER — NITROFURANTOIN MONOHYD MACRO 100 MG PO CAPS
100.0000 mg | ORAL_CAPSULE | Freq: Once | ORAL | Status: AC
  Administered 2024-03-02: 100 mg via ORAL

## 2024-03-02 MED ORDER — NITROFURANTOIN MONOHYD MACRO 100 MG PO CAPS
ORAL_CAPSULE | ORAL | Status: AC
  Filled 2024-03-02: qty 1

## 2024-03-02 MED ORDER — KETOROLAC TROMETHAMINE 30 MG/ML IJ SOLN
15.0000 mg | Freq: Once | INTRAMUSCULAR | Status: AC
  Administered 2024-03-02: 15 mg via INTRAMUSCULAR
  Filled 2024-03-02: qty 1

## 2024-03-02 MED ORDER — NITROFURANTOIN MONOHYD MACRO 100 MG PO CAPS: 100.0000 mg | ORAL_CAPSULE | Freq: Two times a day (BID) | ORAL | 0 refills | Status: AC

## 2024-03-02 MED ORDER — HYDROCODONE-ACETAMINOPHEN 5-325 MG PO TABS: 2.0000 | ORAL_TABLET | ORAL | 0 refills | Status: AC | PRN

## 2024-03-02 NOTE — ED Provider Notes (Signed)
 " [place] EMERGENCY DEPARTMENT AT [facility] Provider Note   CSN: [id number] Arrival date & time: 03/02/24  1717     Patient presents with: Urinary Frequency   Emily Mullins is a 34 y.o. female.    Urinary Frequency  34 year old female presenting with dysuria.  Patient reports this been going on for a few weeks now.  She recently had been put on Keflex  however feels that her UTI never cleared.  She is having some abdominal pain however denies any nausea or vomiting.  She denies any chest pain or shortness of breath.     Prior to Admission medications  Medication Sig Start Date End Date Taking? Authorizing Provider  cephALEXin  (KEFLEX ) 500 MG capsule Take 1 capsule (500 mg total) by mouth 3 (three) times daily. 02/03/24   Geroldine Berg, MD  dicyclomine  (BENTYL ) 20 MG tablet Take 1 tablet (20 mg total) by mouth 2 (two) times daily. 04/08/22   Curatolo, Adam, DO  HYDROcodone -acetaminophen  (NORCO/VICODIN) 5-325 MG tablet Take 1-2 tablets by mouth every 6 (six) hours as needed. 02/03/24   Geroldine Berg, MD  omeprazole  (PRILOSEC) 20 MG capsule Take 1 capsule (20 mg total) by mouth daily. 09/15/20   Doretha Folks, MD  ondansetron  (ZOFRAN  ODT) 8 MG disintegrating tablet Take 1 tablet (8 mg total) by mouth every 8 (eight) hours as needed for nausea or vomiting. 09/14/20   Levander Houston, MD  promethazine  (PHENERGAN ) 25 MG tablet Take 1 tablet (25 mg total) by mouth every 6 (six) hours as needed for nausea or vomiting. 09/14/20   Levander Houston, MD  promethazine -dextromethorphan (PROMETHAZINE -DM) 6.25-15 MG/5ML syrup Take 5 mLs by mouth 4 (four) times daily as needed for cough. 12/11/22   Smoot, Lauraine LABOR, PA-C    Allergies: Strawberry flavoring agent (non-screening)    Review of Systems  Genitourinary:  Positive for dysuria, frequency, hematuria and urgency.  All other systems reviewed and are negative.   Updated Vital Signs BP (!) 139/125 (BP Location: Right Arm)   Pulse 95    Temp 98.4 F (36.9 C)   Resp 16   Ht 5' 6 (1.676 m)   Wt 97.8 kg   LMP 01/20/2024   SpO2 96%   BMI 34.80 kg/m   Physical Exam Vitals and nursing note reviewed.  HENT:     Mouth/Throat:     Pharynx: Oropharynx is clear.  Cardiovascular:     Rate and Rhythm: Normal rate.     Pulses: Normal pulses.  Pulmonary:     Effort: Pulmonary effort is normal.     Breath sounds: Normal breath sounds.  Abdominal:     General: Abdomen is flat. Bowel sounds are normal.     Palpations: Abdomen is soft.     Tenderness: There is generalized abdominal tenderness.  Skin:    General: Skin is warm and dry.  Neurological:     General: No focal deficit present.     Mental Status: She is alert.     (all labs ordered are listed, but only abnormal results are displayed) Labs Reviewed  URINALYSIS, ROUTINE W REFLEX MICROSCOPIC  PREGNANCY, URINE    EKG: None  Radiology: No results found.   Procedures   Medications Ordered in the ED  ketorolac  (TORADOL ) 30 MG/ML injection 15 mg (has no administration in time range)  Medical Decision Making Amount and/or Complexity of Data Reviewed Labs: ordered. Radiology: ordered.  Risk Prescription drug management.   Impression: 34 year old female presenting today with dysuria.  Differential diagnosis include acute cystitis, pyelonephritis, hydronephrosis, kidney stones  Additional History: Patient was able to provide history.  I also reviewed other outpatient notes.  Labs: CBC showed a WBC of 13.7.  BMP shows no acute changes.  Urinalysis shows blood and leukocytes and protein.  Urine pregnancy test was negative.  Urine culture was ordered.  Imaging: CT of the abdomen and pelvis was ordered and showed bladder wall thickening likely caused by acute cystitis.  I reviewed the images and agree with radiologist interpretation.  ED Course/Meds: 34 year old female presenting with dysuria.  Patient appeared in no  acute distress and well.  Patient has recently been seen in the ER for a UTI.  At that time she was prescribed Keflex .  She reports that she has been taking this antibiotic as prescribed however is still having quite a bit of pain.  She has had other UTIs in the past and Keflex  has worked in the past.  Due to the abdominal pain a CT of the abdomen was ordered to evaluate for pyelonephritis.  CT did not show any signs of pyelonephritis.  Patient was then prescribed Macrobid  to treat the UTI.  Patient does not have a PCP.  I educated on the importance of following up with a primary care for these UTIs.  Patient was educated on signs and symptoms of when to return to the ER.  Patient was told how important it is that she finish the whole antibiotic even if she is feeling well patient remained stable while in the ER and at discharge.     Final diagnoses:  None    ED Discharge Orders     None          Emily Mullins 03/02/24 2212    Ellouise Fine K, DO 03/02/24 2319  "

## 2024-03-02 NOTE — Discharge Instructions (Signed)
 You are seen in the ER today for a UTI.  I have sent in a antibiotic for you.  It is very important that you take all of this antibiotic.  I also have bided a referral to a primary care doctor.  I want you to call and schedule an appointment with them so they can also help follow these UTIs.  If your symptoms worsen and you start to develop any worsening back pain, nausea vomiting, fever, chills please return to the ER.

## 2024-03-02 NOTE — ED Triage Notes (Signed)
 Presents for concern of recurrent UTI. Pain with urination and frequency.

## 2024-03-05 LAB — URINE CULTURE: Culture: >=100000 — AB

## 2024-03-06 ENCOUNTER — Telehealth (HOSPITAL_BASED_OUTPATIENT_CLINIC_OR_DEPARTMENT_OTHER): Payer: Self-pay | Admitting: *Deleted

## 2024-03-06 NOTE — Telephone Encounter (Signed)
 Post ED Visit - Positive Culture Follow-up  Culture report reviewed by antimicrobial stewardship pharmacist: [facility] []  Rankin Dee, Pharm.D. []  Venetia Gully, Pharm.D., BCPS AQ-ID []  Garrel Crews, Pharm.D., BCPS []  Almarie Lunger, Pharm.D., BCPS []  Aquia Harbour, [address].D., BCPS, AAHIVP []  Rosaline Bihari, Pharm.D., BCPS, AAHIVP []  Vernell Meier, PharmD, BCPS []  Latanya Hint, PharmD, BCPS []  Donald Medley, PharmD, BCPS []  Rocky Bold, PharmD []  Dorothyann Alert, PharmD, BCPS [x]  Dorn Poot, PharmD  [facility] []  Rosaline Edison, PharmD []  Romona Bliss, PharmD []  Dolphus Roller, PharmD []  Veva Seip, Rph []  Vernell Daunt) Leonce, PharmD []  Eva Allis, PharmD []  Rosaline Millet, PharmD []  Iantha Batch, PharmD []  Arvin Gauss, PharmD []  Wanda Hasting, PharmD []  Ronal Rav, PharmD []  Rocky Slade, PharmD []  Bard Jeans, PharmD   Positive urine culture Treated with Nitrofurantoin , organism sensitive to the same and no further patient follow-up is required at this time.  Emily Mullins 03/06/2024, 10:43 AM
